# Patient Record
Sex: Male | Born: 1940 | Race: White | Hispanic: No | Marital: Married | Smoking: Former smoker
Health system: Southern US, Community
[De-identification: ages and names within clinical notes are randomized; demographics above are authoritative.]

## PROBLEM LIST (undated history)

## (undated) DIAGNOSIS — C801 Malignant (primary) neoplasm, unspecified: Secondary | ICD-10-CM

## (undated) DIAGNOSIS — E119 Type 2 diabetes mellitus without complications: Secondary | ICD-10-CM

## (undated) DIAGNOSIS — N200 Calculus of kidney: Secondary | ICD-10-CM

## (undated) DIAGNOSIS — I219 Acute myocardial infarction, unspecified: Secondary | ICD-10-CM

## (undated) DIAGNOSIS — I499 Cardiac arrhythmia, unspecified: Secondary | ICD-10-CM

## (undated) DIAGNOSIS — I10 Essential (primary) hypertension: Secondary | ICD-10-CM

## (undated) DIAGNOSIS — M1990 Unspecified osteoarthritis, unspecified site: Secondary | ICD-10-CM

## (undated) DIAGNOSIS — G4730 Sleep apnea, unspecified: Secondary | ICD-10-CM

## (undated) DIAGNOSIS — R0602 Shortness of breath: Secondary | ICD-10-CM

## (undated) HISTORY — PX: LITHOTRIPSY: SUR834

## (undated) HISTORY — PX: APPENDECTOMY: SHX54

## (undated) HISTORY — PX: HERNIA REPAIR: SHX51

## (undated) HISTORY — PX: OTHER SURGICAL HISTORY: SHX169

## (undated) HISTORY — PX: CARDIAC CATHETERIZATION: SHX172

## (undated) HISTORY — PX: CARDIAC SURGERY: SHX584

## (undated) HISTORY — PX: COLONOSCOPY W/ POLYPECTOMY: SHX1380

## (undated) HISTORY — PX: SHOULDER SOFT TISSUE BIOPSY: SUR149

---

## 2012-05-27 ENCOUNTER — Encounter (HOSPITAL_COMMUNITY): Payer: Self-pay | Admitting: Pharmacy Technician

## 2012-06-02 ENCOUNTER — Other Ambulatory Visit (HOSPITAL_COMMUNITY): Payer: Self-pay | Admitting: Orthopaedic Surgery

## 2012-06-02 ENCOUNTER — Ambulatory Visit (HOSPITAL_COMMUNITY)
Admission: RE | Admit: 2012-06-02 | Discharge: 2012-06-02 | Disposition: A | Discharge status: Home / Self Care | Payer: Medicare Other | Source: Ambulatory Visit | Attending: Orthopaedic Surgery | Admitting: Orthopaedic Surgery

## 2012-06-02 ENCOUNTER — Encounter (HOSPITAL_COMMUNITY)
Admission: RE | Admit: 2012-06-02 | Discharge: 2012-06-02 | Disposition: A | Discharge status: Home / Self Care | Payer: Medicare Other | Source: Ambulatory Visit | Attending: Orthopaedic Surgery | Admitting: Orthopaedic Surgery

## 2012-06-02 ENCOUNTER — Encounter (HOSPITAL_COMMUNITY): Payer: Self-pay

## 2012-06-02 DIAGNOSIS — Z01818 Encounter for other preprocedural examination: Secondary | ICD-10-CM | POA: Insufficient documentation

## 2012-06-02 DIAGNOSIS — I10 Essential (primary) hypertension: Secondary | ICD-10-CM | POA: Insufficient documentation

## 2012-06-02 HISTORY — DX: Unspecified osteoarthritis, unspecified site: M19.90

## 2012-06-02 HISTORY — DX: Essential (primary) hypertension: I10

## 2012-06-02 HISTORY — DX: Calculus of kidney: N20.0

## 2012-06-02 HISTORY — DX: Sleep apnea, unspecified: G47.30

## 2012-06-02 HISTORY — DX: Cardiac arrhythmia, unspecified: I49.9

## 2012-06-02 HISTORY — DX: Type 2 diabetes mellitus without complications: E11.9

## 2012-06-02 HISTORY — DX: Shortness of breath: R06.02

## 2012-06-02 HISTORY — DX: Malignant (primary) neoplasm, unspecified: C80.1

## 2012-06-02 HISTORY — DX: Acute myocardial infarction, unspecified: I21.9

## 2012-06-02 LAB — APTT: aPTT: 28 seconds (ref 24–37)

## 2012-06-02 LAB — COMPREHENSIVE METABOLIC PANEL
ALT: 23 U/L (ref 0–53)
AST: 26 U/L (ref 0–37)
Albumin: 3.6 g/dL (ref 3.5–5.2)
Alkaline Phosphatase: 57 U/L (ref 39–117)
BUN: 13 mg/dL (ref 6–23)
CO2: 27 mEq/L (ref 19–32)
Calcium: 9.5 mg/dL (ref 8.4–10.5)
Chloride: 101 mEq/L (ref 96–112)
Creatinine, Ser: 0.69 mg/dL (ref 0.50–1.35)
GFR calc Af Amer: >90 mL/min (ref >90)
GFR calc non Af Amer: >90 mL/min (ref >90)
Glucose, Bld: 145 mg/dL — ABNORMAL HIGH (ref 70–99)
Potassium: 4.2 mEq/L (ref 3.5–5.1)
Sodium: 138 mEq/L (ref 135–145)
Total Bilirubin: 0.3 mg/dL (ref 0.3–1.2)
Total Protein: 6.5 g/dL (ref 6.0–8.3)

## 2012-06-02 LAB — CBC
MCH: 30.8 pg (ref 26.0–34.0)
MCHC: 34.6 g/dL (ref 30.0–36.0)
MCV: 89.3 fL (ref 78.0–100.0)
Platelets: 162 10*3/uL (ref 150–400)
RDW: 13.8 % (ref 11.5–15.5)
WBC: 8 10*3/uL (ref 4.0–10.5)

## 2012-06-02 LAB — PROTIME-INR: INR: 1.06 (ref 0.00–1.49)

## 2012-06-02 LAB — SURGICAL PCR SCREEN
MRSA, PCR: NEGATIVE
Staphylococcus aureus: NEGATIVE

## 2012-06-02 NOTE — Progress Notes (Signed)
REQUESTED STRESS TEST, EKG, OV, ECHO FROM DR. BOHLE'S [phone]).

## 2012-06-02 NOTE — Progress Notes (Signed)
NOTIFIED SHERRY AT DR Ophelia Charter OFFICE OF NEEDING PREOP ORDERS.

## 2012-06-02 NOTE — Pre-Procedure Instructions (Signed)
21 [name]  06/02/2012   Your procedure is scheduled on:  Monday  06/08/12  Report to [facility] at 1030 AM.  Call this number if you have problems the morning of surgery: [phone]   Remember:   Do not eat food OR DRINK :After Midnight.     Take these medicines the morning of surgery with A SIP OF WATER:  ALLOPURINOL, LORTAB   Do not wear jewelry, make-up or nail polish.  Do not wear lotions, powders, or perfumes. You may wear deodorant.  Do not shave 48 hours prior to surgery. Men may shave face and neck.  Do not bring valuables to the hospital.  Contacts, dentures or bridgework may not be worn into surgery.  Leave suitcase in the car. After surgery it may be brought to your room.  For patients admitted to the hospital, checkout time is 11:00 AM the day of discharge.   Patients discharged the day of surgery will not be allowed to drive home.  Name and phone number of your driver:  Special Instructions: Shower using CHG 2 nights before surgery and the night before surgery.  If you shower the day of surgery use CHG.  Use special wash - you have one bottle of CHG for all showers.  You should use approximately 1/3 of the bottle for each shower.   Please read over the following fact sheets that you were given: Pain Booklet, Coughing and Deep Breathing, MRSA Information and Surgical Site Infection Prevention

## 2012-06-04 NOTE — Progress Notes (Signed)
Faxed Cardiolite Stress report received from [facility]-no EKG or OV note.  Placed in chart.

## 2012-06-05 ENCOUNTER — Other Ambulatory Visit (HOSPITAL_COMMUNITY): Payer: Self-pay | Admitting: Orthopaedic Surgery

## 2012-06-05 NOTE — Progress Notes (Signed)
No ECHO available,will send office note from -09-27-2011

## 2012-06-05 NOTE — H&P (Signed)
[name] is an 71 y.o. male.   Chief Complaint: pain in back and legs worse with ambulation. HPI: Pt with progressive symptoms of neurogenic claudication.  Unable to ambulate more than 20 ft.  Symptoms include pain in his low back and weakness and tingling in his legs. No bowel or bladder dysfunction. Pt has to stop and rest to prevent falling when he is trying to walk distances.  He has tried physical therapy which has not been helpful. MRI of the lumbar spine showed severe stenosis at the L4-5 level.  He will undergo a central decompression at L4-5.  Past Medical History  Diagnosis Date  . Myocardial infarction   . Dysrhythmia   . Hypertension   . Shortness of breath     WITH EXERTION   . Sleep apnea     CPAP   10  YRS 2003   . Diabetes mellitus without complication   . Kidney stones   . Cancer     PROSTATE 2006  . Arthritis     OA    Past Surgical History  Procedure Date  . Lithotripsy   . Ears     63-65  TUBES BIL EARS  . Hernia repair     1976 [facility]  . Appendectomy     2010  . Shoulder soft tissue biopsy   . Colonoscopy w/ polypectomy     No family history on file. Social History:  reports that he has quit smoking. He does not have any smokeless tobacco history on file. He reports that he drinks alcohol. He reports that he does not use illicit drugs.  Allergies:  Allergies  Allergen Reactions  . Adhesive (Tape) Rash    Medications Prior to Admission  Medication Sig Dispense Refill  . allopurinol (ZYLOPRIM) 300 MG tablet Take 300 mg by mouth daily.      . Ascorbic Acid (VITAMIN C) 1000 MG tablet Take 1,000 mg by mouth daily.      Marland Kitchen aspirin EC 81 MG tablet Take 81 mg by mouth daily.      . Ciclopirox (LOPROX) 1 % shampoo Apply 1 application topically at bedtime. For dermatitis      . fenofibrate micronized (LOFIBRA) 200 MG capsule Take 200 mg by mouth daily before breakfast.      . gabapentin (NEURONTIN) 600 MG tablet Take 600 mg by mouth 3 (three) times  daily.      . Glucosamine-Chondroit-Vit C-Mn (GLUCOSAMINE 1500 COMPLEX PO) Take 1 capsule by mouth daily.      Marland Kitchen HYDROcodone-acetaminophen (LORTAB) 7.5-500 MG per tablet Take 1 tablet by mouth every 6 (six) hours as needed. For pain      . lisinopril (PRINIVIL,ZESTRIL) 10 MG tablet Take 10 mg by mouth daily.      . meclizine (ANTIVERT) 25 MG tablet Take 25 mg by mouth 3 (three) times daily as needed. For motion sickness      . metFORMIN (GLUCOPHAGE) 500 MG tablet Take 500 mg by mouth daily with breakfast.      . Methylsulfonylmethane (MSM PO) Take 1 tablet by mouth daily.      . metroNIDAZOLE (METROGEL) 1 % gel Apply 1 application topically daily as needed. For rash      . minocycline (DYNACIN) 50 MG tablet Take 50 mg by mouth daily as needed. For roceasa      . MOMETASONE FUROATE EX Apply 1 application topically daily as needed. For rash      . Multiple Vitamin (MULTIVITAMIN WITH MINERALS)  TABS Take 1 tablet by mouth daily.      . simvastatin (ZOCOR) 40 MG tablet Take 40 mg by mouth every evening.      . thiamine 100 MG tablet Take 100 mg by mouth daily.      Marland Kitchen zolpidem (AMBIEN) 10 MG tablet Take 10 mg by mouth at bedtime as needed. For sleep        Results for orders placed during the hospital encounter of 06/08/12 (from the past 48 hour(s))  GLUCOSE, CAPILLARY     Status: Abnormal   Collection Time   06/08/12 10:06 AM      Component Value Range Comment   Glucose-Capillary 137 (*) 70 - 99 mg/dL    Dg Lumbar Spine 2-3 Views  06/08/2012  *RADIOLOGY REPORT*  Clinical Data: Spinal stenosis  LUMBAR SPINE - 2-3 VIEW  Comparison: None.  Findings: There looks to be a transitional lumbosacral segment; correlate carefully with any outside cross-sectional imaging before intervention.  Small anterior endplate spurs at multiple levels. Patchy aortic calcifications. There is no evidence of lumbar spine fracture.  Alignment is normal.  Intervertebral disc spaces are maintained.  IMPRESSION: 1.  Small end  plate spurs.  No acute abnormality.   Original Report Authenticated By: D. Andria Rhein, MD     Review of Systems  Constitutional: Negative.   HENT: Negative.   Eyes: Negative.   Respiratory: Negative.   Cardiovascular: Negative.   Gastrointestinal: Negative.   Genitourinary: Negative.   Musculoskeletal: Positive for back pain.  Skin: Negative.   Neurological: Negative.   Endo/Heme/Allergies: Negative.   Psychiatric/Behavioral: Negative.     Blood pressure 116/74, pulse 75, temperature 98.3 F (36.8 C), temperature source Oral, resp. rate 18, SpO2 98.00%. Physical Exam  Constitutional: He is oriented to person, place, and time. He appears well-developed and well-nourished.  HENT:  Head: Normocephalic and atraumatic.  Eyes: EOM are normal. Pupils are equal, round, and reactive to light.  Neck: Normal range of motion. Neck supple.  Cardiovascular: Normal rate and intact distal pulses.   Respiratory: Effort normal.  GI: Soft. Bowel sounds are normal.  Musculoskeletal:       -SLR bilateral.  No focal weakness of lower extremities. Painful ROM of LS spine  Neurological: He is alert and oriented to person, place, and time.  Skin: Skin is warm and dry.  Psychiatric: He has a normal mood and affect.     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

## 2012-06-07 MED ORDER — CEFAZOLIN SODIUM-DEXTROSE 2-3 GM-% IV SOLR
2.0000 g | INTRAVENOUS | Status: AC
  Administered 2012-06-08: 2 g via INTRAVENOUS
  Filled 2012-06-07: qty 50

## 2012-06-08 ENCOUNTER — Ambulatory Visit (HOSPITAL_COMMUNITY): Payer: Medicare Other

## 2012-06-08 ENCOUNTER — Encounter (HOSPITAL_COMMUNITY): Payer: Self-pay | Admitting: Anesthesiology

## 2012-06-08 ENCOUNTER — Encounter (HOSPITAL_COMMUNITY)
Admission: RE | Disposition: A | Discharge status: Home / Self Care | Payer: Self-pay | Source: Ambulatory Visit | Attending: Orthopaedic Surgery

## 2012-06-08 ENCOUNTER — Observation Stay (HOSPITAL_COMMUNITY)
Admission: RE | Admit: 2012-06-08 | Discharge: 2012-06-09 | Disposition: A | Discharge status: Home / Self Care | Payer: Medicare Other | Source: Ambulatory Visit | Attending: Orthopaedic Surgery | Admitting: Orthopaedic Surgery

## 2012-06-08 ENCOUNTER — Ambulatory Visit (HOSPITAL_COMMUNITY): Payer: Medicare Other | Admitting: Anesthesiology

## 2012-06-08 DIAGNOSIS — R0602 Shortness of breath: Secondary | ICD-10-CM | POA: Insufficient documentation

## 2012-06-08 DIAGNOSIS — G4730 Sleep apnea, unspecified: Secondary | ICD-10-CM | POA: Insufficient documentation

## 2012-06-08 DIAGNOSIS — E119 Type 2 diabetes mellitus without complications: Secondary | ICD-10-CM | POA: Insufficient documentation

## 2012-06-08 DIAGNOSIS — M48062 Spinal stenosis, lumbar region with neurogenic claudication: Principal | ICD-10-CM | POA: Insufficient documentation

## 2012-06-08 DIAGNOSIS — I10 Essential (primary) hypertension: Secondary | ICD-10-CM | POA: Insufficient documentation

## 2012-06-08 DIAGNOSIS — I252 Old myocardial infarction: Secondary | ICD-10-CM | POA: Insufficient documentation

## 2012-06-08 DIAGNOSIS — M1990 Unspecified osteoarthritis, unspecified site: Secondary | ICD-10-CM | POA: Insufficient documentation

## 2012-06-08 HISTORY — PX: LUMBAR LAMINECTOMY: SHX95

## 2012-06-08 LAB — GLUCOSE, CAPILLARY
Glucose-Capillary: 137 mg/dL — ABNORMAL HIGH (ref 70–99)
Glucose-Capillary: 120 mg/dL — ABNORMAL HIGH (ref 70–99)
Glucose-Capillary: 128 mg/dL — ABNORMAL HIGH (ref 70–99)
Glucose-Capillary: 103 mg/dL — ABNORMAL HIGH (ref 70–99)

## 2012-06-08 SURGERY — MICRODISCECTOMY LUMBAR LAMINECTOMY
Anesthesia: General | Site: Back | Wound class: Clean

## 2012-06-08 MED ORDER — DEXTROSE 5 % IV SOLN
500.0000 mg | Freq: Four times a day (QID) | INTRAVENOUS | Status: DC | PRN
  Administered 2012-06-08: 500 mg via INTRAVENOUS
  Filled 2012-06-08: qty 5

## 2012-06-08 MED ORDER — ACETAMINOPHEN 650 MG RE SUPP: 650.0000 mg | RECTAL | Status: DC | PRN

## 2012-06-08 MED ORDER — MENTHOL 3 MG MT LOZG: 1.0000 | LOZENGE | OROMUCOSAL | Status: DC | PRN

## 2012-06-08 MED ORDER — FENTANYL CITRATE 0.05 MG/ML IJ SOLN: 50.0000 ug | Freq: Once | INTRAMUSCULAR | Status: DC

## 2012-06-08 MED ORDER — ARTIFICIAL TEARS OP OINT
TOPICAL_OINTMENT | OPHTHALMIC | Status: DC | PRN
  Administered 2012-06-08: 1 via OPHTHALMIC

## 2012-06-08 MED ORDER — METFORMIN HCL 500 MG PO TABS
500.0000 mg | ORAL_TABLET | Freq: Every day | ORAL | Status: DC
  Administered 2012-06-09: 500 mg via ORAL
  Filled 2012-06-08 (×2): qty 1

## 2012-06-08 MED ORDER — FLEET ENEMA 7-19 GM/118ML RE ENEM: 1.0000 | ENEMA | Freq: Once | RECTAL | Status: AC | PRN

## 2012-06-08 MED ORDER — ALLOPURINOL 300 MG PO TABS
300.0000 mg | ORAL_TABLET | Freq: Every day | ORAL | Status: DC
  Filled 2012-06-08: qty 1

## 2012-06-08 MED ORDER — HYDROCODONE-ACETAMINOPHEN 5-325 MG PO TABS: 1.0000 | ORAL_TABLET | ORAL | Status: DC | PRN

## 2012-06-08 MED ORDER — INSULIN ASPART 100 UNIT/ML ~~LOC~~ SOLN
0.0000 [IU] | Freq: Three times a day (TID) | SUBCUTANEOUS | Status: DC
  Administered 2012-06-08 – 2012-06-09 (×2): 2 [IU] via SUBCUTANEOUS

## 2012-06-08 MED ORDER — ACETAMINOPHEN 10 MG/ML IV SOLN
INTRAVENOUS | Status: AC
  Filled 2012-06-08: qty 100

## 2012-06-08 MED ORDER — OXYCODONE HCL 5 MG/5ML PO SOLN: 5.0000 mg | Freq: Once | ORAL | Status: AC | PRN

## 2012-06-08 MED ORDER — PROPOFOL 10 MG/ML IV BOLUS
INTRAVENOUS | Status: DC | PRN
  Administered 2012-06-08: 180 mg via INTRAVENOUS

## 2012-06-08 MED ORDER — LIDOCAINE HCL (CARDIAC) 20 MG/ML IV SOLN
INTRAVENOUS | Status: DC | PRN
  Administered 2012-06-08: 100 mg via INTRAVENOUS

## 2012-06-08 MED ORDER — BISACODYL 10 MG RE SUPP: 10.0000 mg | Freq: Every day | RECTAL | Status: DC | PRN

## 2012-06-08 MED ORDER — PANTOPRAZOLE SODIUM 40 MG PO TBEC
40.0000 mg | DELAYED_RELEASE_TABLET | Freq: Every day | ORAL | Status: DC
  Administered 2012-06-08: 40 mg via ORAL
  Filled 2012-06-08: qty 1

## 2012-06-08 MED ORDER — VECURONIUM BROMIDE 10 MG IV SOLR
INTRAVENOUS | Status: DC | PRN
  Administered 2012-06-08: 2 mg via INTRAVENOUS

## 2012-06-08 MED ORDER — LACTATED RINGERS IV SOLN
INTRAVENOUS | Status: DC
  Administered 2012-06-08: 12:00:00 via INTRAVENOUS

## 2012-06-08 MED ORDER — ONDANSETRON HCL 4 MG/2ML IJ SOLN: 4.0000 mg | INTRAMUSCULAR | Status: DC | PRN

## 2012-06-08 MED ORDER — ROCURONIUM BROMIDE 100 MG/10ML IV SOLN
INTRAVENOUS | Status: DC | PRN
  Administered 2012-06-08: 50 mg via INTRAVENOUS

## 2012-06-08 MED ORDER — FENTANYL CITRATE 0.05 MG/ML IJ SOLN
INTRAMUSCULAR | Status: DC | PRN
  Administered 2012-06-08 (×3): 50 ug via INTRAVENOUS

## 2012-06-08 MED ORDER — SODIUM CHLORIDE 0.9 % IJ SOLN: 3.0000 mL | INTRAMUSCULAR | Status: DC | PRN

## 2012-06-08 MED ORDER — MECLIZINE HCL 25 MG PO TABS
25.0000 mg | ORAL_TABLET | Freq: Three times a day (TID) | ORAL | Status: DC | PRN
  Filled 2012-06-08: qty 1

## 2012-06-08 MED ORDER — SODIUM CHLORIDE 0.9 % IJ SOLN: 3.0000 mL | Freq: Two times a day (BID) | INTRAMUSCULAR | Status: DC

## 2012-06-08 MED ORDER — SODIUM CHLORIDE 0.9 % IV SOLN: 250.0000 mL | INTRAVENOUS | Status: DC

## 2012-06-08 MED ORDER — LACTATED RINGERS IV SOLN
INTRAVENOUS | Status: DC | PRN
  Administered 2012-06-08: 12:00:00 via INTRAVENOUS

## 2012-06-08 MED ORDER — MORPHINE SULFATE 2 MG/ML IJ SOLN: 1.0000 mg | INTRAMUSCULAR | Status: DC | PRN

## 2012-06-08 MED ORDER — THIAMINE HCL 100 MG PO TABS: 100.0000 mg | ORAL_TABLET | Freq: Every day | ORAL | Status: DC

## 2012-06-08 MED ORDER — ACETAMINOPHEN 325 MG PO TABS: 650.0000 mg | ORAL_TABLET | ORAL | Status: DC | PRN

## 2012-06-08 MED ORDER — 0.9 % SODIUM CHLORIDE (POUR BTL) OPTIME
TOPICAL | Status: DC | PRN
  Administered 2012-06-08: 1000 mL

## 2012-06-08 MED ORDER — CEFAZOLIN SODIUM 1-5 GM-% IV SOLN
1.0000 g | Freq: Three times a day (TID) | INTRAVENOUS | Status: AC
  Administered 2012-06-08 – 2012-06-09 (×2): 1 g via INTRAVENOUS
  Filled 2012-06-08 (×2): qty 50

## 2012-06-08 MED ORDER — ACETAMINOPHEN 10 MG/ML IV SOLN
1000.0000 mg | Freq: Once | INTRAVENOUS | Status: AC
  Administered 2012-06-08: 1000 mg via INTRAVENOUS
  Filled 2012-06-08: qty 100

## 2012-06-08 MED ORDER — OXYCODONE-ACETAMINOPHEN 5-325 MG PO TABS: ORAL_TABLET | ORAL | Status: DC

## 2012-06-08 MED ORDER — OXYCODONE HCL 5 MG PO TABS
ORAL_TABLET | ORAL | Status: AC
  Filled 2012-06-08: qty 1

## 2012-06-08 MED ORDER — VITAMIN C 500 MG PO TABS
1000.0000 mg | ORAL_TABLET | Freq: Every day | ORAL | Status: DC
  Filled 2012-06-08: qty 2

## 2012-06-08 MED ORDER — METOCLOPRAMIDE HCL 5 MG/ML IJ SOLN
10.0000 mg | Freq: Three times a day (TID) | INTRAMUSCULAR | Status: DC
  Filled 2012-06-08: qty 2

## 2012-06-08 MED ORDER — SIMVASTATIN 40 MG PO TABS
40.0000 mg | ORAL_TABLET | Freq: Every day | ORAL | Status: DC
  Administered 2012-06-08: 40 mg via ORAL
  Filled 2012-06-08 (×2): qty 1

## 2012-06-08 MED ORDER — GLYCOPYRROLATE 0.2 MG/ML IJ SOLN
INTRAMUSCULAR | Status: DC | PRN
  Administered 2012-06-08: 0.6 mg via INTRAVENOUS

## 2012-06-08 MED ORDER — DOCUSATE SODIUM 100 MG PO CAPS
100.0000 mg | ORAL_CAPSULE | Freq: Two times a day (BID) | ORAL | Status: DC
  Administered 2012-06-08: 100 mg via ORAL
  Filled 2012-06-08 (×3): qty 1

## 2012-06-08 MED ORDER — ASPIRIN EC 81 MG PO TBEC
81.0000 mg | DELAYED_RELEASE_TABLET | Freq: Every day | ORAL | Status: DC
  Filled 2012-06-08: qty 1

## 2012-06-08 MED ORDER — MIDAZOLAM HCL 2 MG/2ML IJ SOLN: 1.0000 mg | INTRAMUSCULAR | Status: DC | PRN

## 2012-06-08 MED ORDER — ONDANSETRON HCL 4 MG/2ML IJ SOLN
INTRAMUSCULAR | Status: DC | PRN
  Administered 2012-06-08: 4 mg via INTRAVENOUS

## 2012-06-08 MED ORDER — PROMETHAZINE HCL 25 MG/ML IJ SOLN: 6.2500 mg | INTRAMUSCULAR | Status: DC | PRN

## 2012-06-08 MED ORDER — METHOCARBAMOL 500 MG PO TABS: 500.0000 mg | ORAL_TABLET | Freq: Four times a day (QID) | ORAL | Status: DC

## 2012-06-08 MED ORDER — OXYCODONE HCL 5 MG PO TABS
5.0000 mg | ORAL_TABLET | Freq: Once | ORAL | Status: AC | PRN
  Administered 2012-06-08: 5 mg via ORAL

## 2012-06-08 MED ORDER — KETOROLAC TROMETHAMINE 30 MG/ML IJ SOLN
INTRAMUSCULAR | Status: AC
  Filled 2012-06-08: qty 1

## 2012-06-08 MED ORDER — FENOFIBRATE 160 MG PO TABS
160.0000 mg | ORAL_TABLET | Freq: Every day | ORAL | Status: DC
  Administered 2012-06-08: 160 mg via ORAL
  Filled 2012-06-08 (×2): qty 1

## 2012-06-08 MED ORDER — METRONIDAZOLE 1 % EX GEL
1.0000 "application " | Freq: Every day | CUTANEOUS | Status: DC | PRN
  Filled 2012-06-08: qty 1

## 2012-06-08 MED ORDER — SENNOSIDES-DOCUSATE SODIUM 8.6-50 MG PO TABS: 1.0000 | ORAL_TABLET | Freq: Every evening | ORAL | Status: DC | PRN

## 2012-06-08 MED ORDER — NEOSTIGMINE METHYLSULFATE 1 MG/ML IJ SOLN
INTRAMUSCULAR | Status: DC | PRN
  Administered 2012-06-08: 5 mg via INTRAVENOUS

## 2012-06-08 MED ORDER — OXYCODONE-ACETAMINOPHEN 5-325 MG PO TABS
1.0000 | ORAL_TABLET | ORAL | Status: DC | PRN
  Administered 2012-06-08: 1 via ORAL
  Filled 2012-06-08: qty 1

## 2012-06-08 MED ORDER — METHOCARBAMOL 500 MG PO TABS
500.0000 mg | ORAL_TABLET | Freq: Four times a day (QID) | ORAL | Status: DC | PRN
  Administered 2012-06-08: 500 mg via ORAL
  Filled 2012-06-08 (×2): qty 1

## 2012-06-08 MED ORDER — HYDROMORPHONE HCL PF 1 MG/ML IJ SOLN: 0.2500 mg | INTRAMUSCULAR | Status: DC | PRN

## 2012-06-08 MED ORDER — ZOLPIDEM TARTRATE 5 MG PO TABS
5.0000 mg | ORAL_TABLET | Freq: Every evening | ORAL | Status: DC | PRN
  Administered 2012-06-08: 5 mg via ORAL
  Filled 2012-06-08: qty 1

## 2012-06-08 MED ORDER — BUPIVACAINE HCL (PF) 0.25 % IJ SOLN
INTRAMUSCULAR | Status: DC | PRN
  Administered 2012-06-08: 10 mL

## 2012-06-08 MED ORDER — PANTOPRAZOLE SODIUM 40 MG IV SOLR
40.0000 mg | Freq: Every day | INTRAVENOUS | Status: DC
  Filled 2012-06-08: qty 40

## 2012-06-08 MED ORDER — LISINOPRIL 10 MG PO TABS
10.0000 mg | ORAL_TABLET | Freq: Every day | ORAL | Status: DC
  Filled 2012-06-08: qty 1

## 2012-06-08 MED ORDER — BUPIVACAINE HCL (PF) 0.25 % IJ SOLN
INTRAMUSCULAR | Status: AC
  Filled 2012-06-08: qty 30

## 2012-06-08 MED ORDER — GABAPENTIN 600 MG PO TABS
600.0000 mg | ORAL_TABLET | Freq: Three times a day (TID) | ORAL | Status: DC
  Administered 2012-06-08 (×2): 600 mg via ORAL
  Filled 2012-06-08 (×5): qty 1

## 2012-06-08 MED ORDER — PHENOL 1.4 % MT LIQD: 1.0000 | OROMUCOSAL | Status: DC | PRN

## 2012-06-08 MED ORDER — POTASSIUM CHLORIDE IN NACL 20-0.45 MEQ/L-% IV SOLN
INTRAVENOUS | Status: DC
  Administered 2012-06-09: 02:00:00 via INTRAVENOUS
  Filled 2012-06-08 (×2): qty 1000

## 2012-06-08 MED ORDER — VITAMIN B-1 100 MG PO TABS
100.0000 mg | ORAL_TABLET | Freq: Every day | ORAL | Status: DC
  Filled 2012-06-08: qty 1

## 2012-06-08 MED ORDER — KETOROLAC TROMETHAMINE 30 MG/ML IJ SOLN
30.0000 mg | Freq: Three times a day (TID) | INTRAMUSCULAR | Status: AC
  Administered 2012-06-08 – 2012-06-09 (×3): 30 mg via INTRAVENOUS
  Filled 2012-06-08 (×5): qty 1

## 2012-06-08 SURGICAL SUPPLY — 53 items
BENZOIN TINCTURE PRP APPL 2/3 (GAUZE/BANDAGES/DRESSINGS) IMPLANT
BUR ROUND FLUTED 4 SOFT TCH (BURR) ×2 IMPLANT
CANISTER SUCTION 2500CC (MISCELLANEOUS) ×2 IMPLANT
CLOTH BEACON ORANGE TIMEOUT ST (SAFETY) ×2 IMPLANT
CORDS BIPOLAR (ELECTRODE) IMPLANT
COVER SURGICAL LIGHT HANDLE (MISCELLANEOUS) ×2 IMPLANT
DERMABOND ADVANCED (GAUZE/BANDAGES/DRESSINGS) ×1
DERMABOND ADVANCED .7 DNX12 (GAUZE/BANDAGES/DRESSINGS) ×1 IMPLANT
DRAPE MICROSCOPE LEICA (MISCELLANEOUS) ×2 IMPLANT
DRAPE PROXIMA HALF (DRAPES) ×2 IMPLANT
DRSG EMULSION OIL 3X3 NADH (GAUZE/BANDAGES/DRESSINGS) IMPLANT
DRSG MEPILEX BORDER 4X4 (GAUZE/BANDAGES/DRESSINGS) ×2 IMPLANT
DURAPREP 26ML APPLICATOR (WOUND CARE) ×2 IMPLANT
ELECT REM PT RETURN 9FT ADLT (ELECTROSURGICAL) ×2
ELECTRODE REM PT RTRN 9FT ADLT (ELECTROSURGICAL) ×1 IMPLANT
GLOVE BIO SURGEON STRL SZ8.5 (GLOVE) ×4 IMPLANT
GLOVE BIOGEL PI IND STRL 7.5 (GLOVE) ×1 IMPLANT
GLOVE BIOGEL PI IND STRL 8 (GLOVE) ×1 IMPLANT
GLOVE BIOGEL PI INDICATOR 7.5 (GLOVE) ×1
GLOVE BIOGEL PI INDICATOR 8 (GLOVE) ×1
GLOVE ECLIPSE 7.0 STRL STRAW (GLOVE) ×2 IMPLANT
GLOVE ORTHO TXT STRL SZ7.5 (GLOVE) ×2 IMPLANT
GLOVE SURG SS PI 8.5 STRL IVOR (GLOVE) ×2
GLOVE SURG SS PI 8.5 STRL STRW (GLOVE) ×2 IMPLANT
GOWN PREVENTION PLUS LG XLONG (DISPOSABLE) ×2 IMPLANT
GOWN STRL NON-REIN LRG LVL3 (GOWN DISPOSABLE) IMPLANT
GOWN STRL REIN 2XL XLG LVL4 (GOWN DISPOSABLE) ×2 IMPLANT
KIT BASIN OR (CUSTOM PROCEDURE TRAY) ×2 IMPLANT
KIT ROOM TURNOVER OR (KITS) ×2 IMPLANT
MANIFOLD NEPTUNE II (INSTRUMENTS) IMPLANT
NDL SUT .5 MAYO 1.404X.05X (NEEDLE) IMPLANT
NEEDLE HYPO 25GX1X1/2 BEV (NEEDLE) ×2 IMPLANT
NEEDLE MAYO TAPER (NEEDLE)
NEEDLE SPNL 18GX3.5 QUINCKE PK (NEEDLE) ×2 IMPLANT
NS IRRIG 1000ML POUR BTL (IV SOLUTION) ×2 IMPLANT
PACK LAMINECTOMY ORTHO (CUSTOM PROCEDURE TRAY) ×2 IMPLANT
PAD ARMBOARD 7.5X6 YLW CONV (MISCELLANEOUS) ×4 IMPLANT
PATTIES SURGICAL .5 X.5 (GAUZE/BANDAGES/DRESSINGS) IMPLANT
PATTIES SURGICAL .75X.75 (GAUZE/BANDAGES/DRESSINGS) IMPLANT
SPONGE GAUZE 4X4 12PLY (GAUZE/BANDAGES/DRESSINGS) IMPLANT
STRIP CLOSURE SKIN 1/2X4 (GAUZE/BANDAGES/DRESSINGS) IMPLANT
SUT VIC AB 0 CT1 27 (SUTURE) ×1
SUT VIC AB 0 CT1 27XBRD ANBCTR (SUTURE) ×1 IMPLANT
SUT VIC AB 2-0 CT1 27 (SUTURE) ×1
SUT VIC AB 2-0 CT1 TAPERPNT 27 (SUTURE) ×1 IMPLANT
SUT VICRYL 0 TIES 12 18 (SUTURE) IMPLANT
SUT VICRYL 4-0 PS2 18IN ABS (SUTURE) ×2 IMPLANT
SUT VICRYL AB 2 0 TIES (SUTURE) IMPLANT
SYR 20ML ECCENTRIC (SYRINGE) IMPLANT
SYR CONTROL 10ML LL (SYRINGE) ×4 IMPLANT
TOWEL OR 17X24 6PK STRL BLUE (TOWEL DISPOSABLE) ×2 IMPLANT
TOWEL OR 17X26 10 PK STRL BLUE (TOWEL DISPOSABLE) ×2 IMPLANT
WATER STERILE IRR 1000ML POUR (IV SOLUTION) IMPLANT

## 2012-06-08 NOTE — Transfer of Care (Signed)
Immediate Anesthesia Transfer of Care Note  Patient: [name]  Procedure(s) Performed: Procedure(s) (LRB) with comments: MICRODISCECTOMY LUMBAR LAMINECTOMY (N/A) - L4-5 decompression  Patient Location: PACU  Anesthesia Type:General  Level of Consciousness: awake, alert , oriented and patient cooperative  Airway & Oxygen Therapy: Patient Spontanous Breathing and Patient connected to face mask oxygen  Post-op Assessment: Report given to PACU RN and Post -op Vital signs reviewed and stable  Post vital signs: Reviewed  Complications: No apparent anesthesia complications

## 2012-06-08 NOTE — Preoperative (Signed)
Beta Blockers   Reason not to administer Beta Blockers:Not Applicable 

## 2012-06-08 NOTE — Anesthesia Postprocedure Evaluation (Signed)
  Anesthesia Post-op Note  Patient: [name]  Procedure(s) Performed: Procedure(s) (LRB) with comments: MICRODISCECTOMY LUMBAR LAMINECTOMY (N/A) - L4-5 decompression  Patient Location: PACU  Anesthesia Type:General  Level of Consciousness: awake  Airway and Oxygen Therapy: Patient Spontanous Breathing  Post-op Pain: mild  Post-op Assessment: Post-op Vital signs reviewed, Patient's Cardiovascular Status Stable, Respiratory Function Stable, Patent Airway, No signs of Nausea or vomiting and Pain level controlled  Post-op Vital Signs: stable  Complications: No apparent anesthesia complications

## 2012-06-08 NOTE — Brief Op Note (Cosign Needed)
06/08/2012  2:04 PM  PATIENT:  Drucie Opitz  71 y.o. male  PRE-OPERATIVE DIAGNOSIS:  L4-5 stenosis  POST-OPERATIVE DIAGNOSIS:  L4-5 stenosis  PROCEDURE:  Procedure(s) (LRB) with comments: MICRODISCECTOMY LUMBAR LAMINECTOMY (N/A) - L4-5 decompression  SURGEON:  Surgeon(s) and Role:    * Eldred Manges, MD - Primary  PHYSICIAN ASSISTANT: Maud Deed PAC  ASSISTANTS: none   ANESTHESIA:   general  EBL:  Total I/O In: 1200 [I.V.:1200] Out: -   BLOOD ADMINISTERED:none  DRAINS: none   LOCAL MEDICATIONS USED:  MARCAINE     SPECIMEN:  No Specimen  DISPOSITION OF SPECIMEN:  N/A  COUNTS:  YES  TOURNIQUET:  * No tourniquets in log *  DICTATION: .Note written in EPIC  PLAN OF CARE: Admit for overnight observation  PATIENT DISPOSITION:  PACU - hemodynamically stable.   Delay start of Pharmacological VTE agent (>24hrs) due to surgical blood loss or risk of bleeding: yes

## 2012-06-08 NOTE — Interval H&P Note (Signed)
History and Physical Interval Note:  06/08/2012 12:12 PM  [name]  has presented today for surgery, with the diagnosis of L4-5 stenosis  The various methods of treatment have been discussed with the patient and family. After consideration of risks, benefits and other options for treatment, the patient has consented to  Procedure(s) (LRB) with comments: MICRODISCECTOMY LUMBAR LAMINECTOMY (N/A) - L4-5 decompression as a surgical intervention .  The patient's history has been reviewed, patient examined, no change in status, stable for surgery.  I have reviewed the patient's chart and labs.  Questions were answered to the patient's satisfaction.     YATES,MARK C

## 2012-06-08 NOTE — Anesthesia Preprocedure Evaluation (Addendum)
Anesthesia Evaluation  Patient identified by MRN, date of birth, ID band Patient awake    Reviewed: Allergy & Precautions, H&P , NPO status , Patient's Chart, lab work & pertinent test results  History of Anesthesia Complications Negative for: history of anesthetic complications  Airway Mallampati: II TM Distance: <3 FB Neck ROM: Full    Dental  (+) Teeth Intact, Dental Advisory Given, Poor Dentition and Chipped   Pulmonary shortness of breath and with exertion, sleep apnea and Continuous Positive Airway Pressure Ventilation , former smoker,  breath sounds clear to auscultation        Cardiovascular hypertension, Pt. on medications + CAD and + Past MI + dysrhythmias Rhythm:Regular Rate:Normal     Neuro/Psych    GI/Hepatic   Endo/Other  diabetes, Type 2, Oral Hypoglycemic Agents  Renal/GU Kidney stones      Musculoskeletal   Abdominal (+) + obese,   Peds  Hematology   Anesthesia Other Findings   Reproductive/Obstetrics                         Anesthesia Physical Anesthesia Plan  ASA: III  Anesthesia Plan: General   Post-op Pain Management:    Induction: Intravenous  Airway Management Planned: Oral ETT  Additional Equipment:   Intra-op Plan:   Post-operative Plan: Extubation in OR  Informed Consent: I have reviewed the patients History and Physical, chart, labs and discussed the procedure including the risks, benefits and alternatives for the proposed anesthesia with the patient or authorized representative who has indicated his/her understanding and acceptance.     Plan Discussed with: Surgeon and CRNA  Anesthesia Plan Comments:         Anesthesia Quick Evaluation

## 2012-06-08 NOTE — Anesthesia Procedure Notes (Signed)
Procedure Name: Intubation Date/Time: 06/08/2012 12:30 PM Performed by: Lovie Chol Pre-anesthesia Checklist: Patient identified, Emergency Drugs available, Suction available, Patient being monitored and Timeout performed Patient Re-evaluated:Patient Re-evaluated prior to inductionOxygen Delivery Method: Circle system utilized Preoxygenation: Pre-oxygenation with 100% oxygen Intubation Type: IV induction Ventilation: Oral airway inserted - appropriate to patient size and Mask ventilation without difficulty Laryngoscope Size: Miller and 2 Grade View: Grade II Tube type: Oral Tube size: 7.5 mm Number of attempts: 1 Airway Equipment and Method: Stylet Placement Confirmation: ETT inserted through vocal cords under direct vision,  positive ETCO2,  CO2 detector and breath sounds checked- equal and bilateral Secured at: 22 cm Tube secured with: Tape Dental Injury: Teeth and Oropharynx as per pre-operative assessment

## 2012-06-08 NOTE — Progress Notes (Signed)
Call to Dr. Mindi Curling office in [place], requested OV note fr. 2013. Spoke with [employer].

## 2012-06-08 NOTE — Op Note (Signed)
Test test test  Preop diagnosis: L4-5 spinal stenosis with neurogenic claudication  Postop diagnosis: Same  Procedure: L4-5 decompression and foraminotomy.  Surgeon: Annell Greening M.D.  Assistant: Maud Deed PA-C medically necessary and present for the entire procedure  Anesthesia: Gen. Plus Marcaine skin local  EBL minimal  Complications: None  This 71 year old male set progressive neurogenic claudication symptoms of worry is able to walk 100 feet at the best and sometimes only 50-75 feet. He has pain when he stands hassling or grocery cart and ORIF ambulate in a store and is having no significant right leg weakness. MRI scan showed severe stenosis at L4-5 with extradural cyst with the severe stenosis.  Procedure: After induction general anesthesia preoperative 2 g Ancef basal patient's weight at 119 kg back was prepped with DuraPrep pads are placed underneath the shoulders ulnar nerve. DuraPrep was used here squared with towels Betadine Steri-Drape applied and laminectomy sheet and drape. Timeout procedure completed needle localization initially needle was appropriate for spacer was removed out 1 left L4 5 repeat x-ray taken in this was marked. Incision was made midline subperiosteal dissection of the lamina and then a Coker clamp was placed at the top and the bottom of the planned area for decompression. 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 His predominant problem was ligamentous with him facet spurs extending out into the canal midway between anterior and posterior bony up into the canal. Chunks of thick ligament was removed upper microscope was draped and brought in. Continue removal of ligamentous material to the dura was visualized. TobraDex of the dura performed and overhanging spurs removed at the level of the pedicle. Neuro rounded up and due to previous epidural injections the  a ligamentum and the assistant is present on the right side was adhered to the dura microsecond techniques were used in order to remove and separate the Giemsa ligament and the cyst from the dura. The dura was intact once the decompression was finished hockey-stick could be run in both gutters with no length overhanging areas of ligament causing problems nerve root entry of the foramina was open. No with a pressure of the facets is hypomobility of the facet. Patient had no instability in flexion-extension x-rays. Pars was intact bilaterally. The microdiscectomy was done due to the increased mobility of the joints and the disc was firm. After initial saline solution Sandler closure with #1 Vicryl in deep fascia to work sent canis tissue for Vicryl subcuticular closure Dermabond postop dressing and transferred recovery. His count needle count was correct procedure was as posted.          t Vernon Prey.D.

## 2012-06-08 NOTE — Progress Notes (Signed)
Call to Dr. Gypsy Balsam, after rec'ing note from [facility] cardiology.  Reviewed stress test/notes /w Dr. Gypsy Balsam.

## 2012-06-09 ENCOUNTER — Encounter (HOSPITAL_COMMUNITY): Payer: Self-pay | Admitting: Orthopaedic Surgery

## 2012-06-09 NOTE — Progress Notes (Signed)
Pt provided with discharge instructions and follow up information. Dressing changed and patient has no complaints of pain at this time. He is going home with wife.

## 2012-06-09 NOTE — Discharge Summary (Signed)
Physician Discharge Summary  Patient ID: [name] MRN: [id number] DOB/AGE: [birth year] 71 y.o.  Admit date: 06/08/2012 Discharge date: 06/09/2012  Admission Diagnoses:L4-5 stenosis with neurogenic claudication  Discharge Diagnoses: same Principal Problem:  *Spinal stenosis, lumbar region, with neurogenic claudication   Discharged Condition: good  Hospital Course: underwent decompression surgery L4-5 , post op CPAP as his usual at home.   Consults: None  Significant Diagnostic Studies:  Treatments: surgery: as above  Discharge Exam: Blood pressure 110/62, pulse 64, temperature 98.4 F (36.9 C), temperature source Oral, resp. rate 16, SpO2 96.00%. AT, EHL right and left 5/5 sensory intact. dressing dry.   Disposition: home  Discharge.       Medication List     As of 06/09/2012  7:18 AM    TAKE these medications         allopurinol 300 MG tablet   Commonly known as: ZYLOPRIM   Take 300 mg by mouth daily.      aspirin EC 81 MG tablet   Take 81 mg by mouth daily.      fenofibrate micronized 200 MG capsule   Commonly known as: LOFIBRA   Take 200 mg by mouth daily before breakfast.      gabapentin 600 MG tablet   Commonly known as: NEURONTIN   Take 600 mg by mouth 3 (three) times daily.      GLUCOSAMINE 1500 COMPLEX PO   Take 1 capsule by mouth daily.      HYDROcodone-acetaminophen 7.5-500 MG per tablet   Commonly known as: LORTAB   Take 1 tablet by mouth every 6 (six) hours as needed. For pain      lisinopril 10 MG tablet   Commonly known as: PRINIVIL,ZESTRIL   Take 10 mg by mouth daily.      LOPROX 1 % shampoo   Generic drug: Ciclopirox   Apply 1 application topically at bedtime. For dermatitis      meclizine 25 MG tablet   Commonly known as: ANTIVERT   Take 25 mg by mouth 3 (three) times daily as needed. For motion sickness      metFORMIN 500 MG tablet   Commonly known as: GLUCOPHAGE   Take 500 mg by mouth daily with breakfast.     methocarbamol 500 MG tablet   Commonly known as: ROBAXIN   Take 1 tablet (500 mg total) by mouth 4 (four) times daily. As needed for muscle spasm      metroNIDAZOLE 1 % gel   Commonly known as: METROGEL   Apply 1 application topically daily as needed. For rash      minocycline 50 MG tablet   Commonly known as: DYNACIN   Take 50 mg by mouth daily as needed. For roceasa      MOMETASONE FUROATE EX   Apply 1 application topically daily as needed. For rash      MSM PO   Take 1 tablet by mouth daily.      multivitamin with minerals Tabs   Take 1 tablet by mouth daily.      oxyCODONE-acetaminophen 5-325 MG per tablet   Commonly known as: PERCOCET/ROXICET   1-2 q 4-6 hrs prn pain      simvastatin 40 MG tablet   Commonly known as: ZOCOR   Take 40 mg by mouth every evening.      thiamine 100 MG tablet   Take 100 mg by mouth daily.      vitamin C 1000 MG tablet   Take 1,000  mg by mouth daily.      zolpidem 10 MG tablet   Commonly known as: AMBIEN   Take 10 mg by mouth at bedtime as needed. For sleep           Follow-up Information    Follow up with Eldred Manges, MD. Schedule an appointment as soon as possible for a visit in 2 weeks.   Contact information:   [address] [place] [address] [phone]          Signed: Eldred Manges 06/09/2012, 7:18 AM

## 2012-06-09 NOTE — Progress Notes (Signed)
Pt was placed on CPAP of 15 cmH2O. Pt's home mask and [facility] CPAP unit. Vitals are stable HR 61 RR 18 Oxygen saturation is 97%. Pt is comfortable RT will continue to monitor.

## 2012-11-02 ENCOUNTER — Other Ambulatory Visit (HOSPITAL_COMMUNITY): Payer: Self-pay | Admitting: Orthopaedic Surgery

## 2012-11-16 ENCOUNTER — Encounter (HOSPITAL_COMMUNITY): Payer: Self-pay | Admitting: Pharmacy Technician

## 2012-11-18 ENCOUNTER — Encounter (HOSPITAL_COMMUNITY): Payer: Self-pay

## 2012-11-18 ENCOUNTER — Encounter (HOSPITAL_COMMUNITY)
Admission: RE | Admit: 2012-11-18 | Discharge: 2012-11-18 | Disposition: A | Discharge status: Home / Self Care | Payer: Medicare Other | Source: Ambulatory Visit | Attending: Orthopaedic Surgery | Admitting: Orthopaedic Surgery

## 2012-11-18 ENCOUNTER — Other Ambulatory Visit (HOSPITAL_COMMUNITY): Payer: Medicare Other

## 2012-11-18 LAB — CBC
Hemoglobin: 14 g/dL (ref 13.0–17.0)
MCH: 30.4 pg (ref 26.0–34.0)
MCHC: 35.1 g/dL (ref 30.0–36.0)
Platelets: 168 10*3/uL (ref 150–400)
RBC: 4.6 MIL/uL (ref 4.22–5.81)

## 2012-11-18 LAB — TYPE AND SCREEN
ABO/RH(D): B POS
Antibody Screen: NEGATIVE

## 2012-11-18 LAB — COMPREHENSIVE METABOLIC PANEL
ALT: 33 U/L (ref 0–53)
AST: 22 U/L (ref 0–37)
Alkaline Phosphatase: 85 U/L (ref 39–117)
Calcium: 9.3 mg/dL (ref 8.4–10.5)
GFR calc Af Amer: >90 mL/min (ref >90)
Glucose, Bld: 173 mg/dL — ABNORMAL HIGH (ref 70–99)
Potassium: 4 mEq/L (ref 3.5–5.1)
Sodium: 141 mEq/L (ref 135–145)
Total Protein: 7 g/dL (ref 6.0–8.3)

## 2012-11-18 LAB — APTT: aPTT: 28 seconds (ref 24–37)

## 2012-11-18 LAB — SURGICAL PCR SCREEN
MRSA, PCR: NEGATIVE
Staphylococcus aureus: POSITIVE — AB

## 2012-11-18 NOTE — Progress Notes (Signed)
Office called and stated they were unable to access sleep study from 2003.

## 2012-11-18 NOTE — Pre-Procedure Instructions (Signed)
[name]  11/18/2012   Your procedure is scheduled on:      Monday  11/23/12  Report to [facility] at 920 AM.  Call this number if you have problems the morning of surgery: [phone]   Remember:   Do not eat food or drink liquids after midnight.   Take these medicines the morning of surgery with A SIP OF WATER:  ALLOPURINOL   Do not wear jewelry, make-up or nail polish.  Do not wear lotions, powders, or perfumes. You may wear deodorant.  Do not shave 48 hours prior to surgery. Men may shave face and neck.  Do not bring valuables to the hospital.  [facility] is not responsible                   for any belongings or valuables.  Contacts, dentures or bridgework may not be worn into surgery.  Leave suitcase in the car. After surgery it may be brought to your room.  For patients admitted to the hospital, checkout time is 11:00 AM the day of  discharge.   Patients discharged the day of surgery will not be allowed to drive  home.  Name and phone number of your driver:  Special Instructions: Shower using CHG 2 nights before surgery and the night before surgery.  If you shower the day of surgery use CHG.  Use special wash - you have one bottle of CHG for all showers.  You should use approximately 1/3 of the bottle for each shower.   Please read over the following fact sheets that you were given: Pain Booklet, Coughing and Deep Breathing, MRSA Information and Surgical Site Infection Prevention

## 2012-11-22 MED ORDER — DEXTROSE 5 % IV SOLN
3.0000 g | INTRAVENOUS | Status: AC
  Administered 2012-11-23: 3 g via INTRAVENOUS
  Filled 2012-11-22: qty 3000

## 2012-11-22 NOTE — H&P (Signed)
[facility]   A Division of [employer]   [address], [place], [place] [address] Telephone: [phone]  Fax: [phone]     PATIENT: Nathaniel, Duarte   MR#: [id number]  DOB: [birth year]   Visit Date: 10/27/2012    Chief Complaint: Painful Right Hip Osteoarthritis A 72 year old male returns, and he is ambulatory with a cane.  He states since his lumbar decompression he has been able to walk further.  Originally could only walk 10 to 15 feet, and now he can walk several hundred feet, but with ambulation he is beginning to have an occasional sharp popping in his hip, which pain he rates at an 8/10 with catching.  He has to stop and freeze.  The pain radiates anterior in the thigh, sometimes stopping above the knee.  No pain on the opposite, left side.  If he stops and sits, he gets some relief.  Sometimes he will have soreness in that region for a few hours.  By the end of the day, he has increased pain.  He has been taking Percocet for the pain.  He still uses a cane in the opposite, left hand.  He states he has great difficulty walking without the cane.   MEDICATIONS:  Include allopurinol 300 mg for his gout, simvastatin 40 mg daily, fenofibrate 200 mg daily, lisinopril 10 mg once a day, metformin 500 mg once a day, meclizine p.r.n. dizziness, minocycline for rosacea flare-ups, gabapentin 600 mg t.i.d., and the Percocet as needed for pain.  He has some creams that he uses for skin rashes and takes some multivitamins, vitamins C, B12, glucosamine chondroitin, calcium, and 1 aspirin a day.  He had some hydrocodone called in from July.  He has been using this when he ran out of his Percocet supply.   PRIMARY CARE PHYSICIAN:  Dr. Lequita Halt.    PAST SURGICAL HISTORY:  Includes kidney stone lithotripsy in 1993, prostate seeds in December 2006, hernia surgery in 1976, ear surgery in 1963 and 1964, appendectomy in 2010.   FAMILY HISTORY:  Positive for diabetes,  heart disease, hypertension, prostate surgery, hearing loss, and gout.   SOCIAL HISTORY:  Married to his wife, [name], who is with him today.  He is retired.  Does not smoke; he did smoke for about 10 years.  He rarely drinks.   REVIEW OF SYSTEMS:  Positive for diabetes, gout, hypertension, sleep apnea, irregular heartbeat, and cancer.  He has had improvement in his severe neurogenic claudication since his lumbar decompression surgery.   PHYSICAL EXAMINATION:  Patient is alert and oriented x3.  Height 5 feet 10 inches, weight 254.  Extraocular movements intact.  Reflexes are 2+ and symmetrical.  No rash over exposed skin.  No accessory muscle inspiratory effort.  Lungs:  Clear.  Heart:  Regular rate and rhythm.  He has a history of irregularity but not noted today.  Abdomen:  Soft, nontender.  He has reproduction of his pain with 10 degrees of internal rotation of his right hip.  No limitation of internal and external rotation at 45 and 45 of his left hip.  No hip flexion contracture in either hip.  Pain with internal rotation of the right hip with and without distraction.  Sensory testing is normal.  Negative straight leg raising.  Distal pulses are 2+.  No venous stasis changes.   RADIOGRAPHS:  X-rays are obtained, AP pelvis and frog-leg of his right hip.  This shows collapse of the joint space,  bone-on-bone changes, marginal osteophytes, subchondral cyst formation on both sides of the joint, and no sclerosis.  On frog-leg view there is no joint space noted.  Seed implants are present.  The opposite hip shows no hip osteoarthritis.  Lumbar decompression area has a normal appearance.   ASSESSMENT:  Severe right hip osteoarthritis, bone-on-bone changes.   PLAN:  We discussed options.  He has taken anti-inflammatories without relief.  He is having sharp catching in his hip and states that if he did not have his walker or his cane with him, he would have fallen several times.  We reviewed options in  severe hip osteoarthritis.  He states he would like to proceed with hip arthritis surgery with total hip arthroplasty.  Procedure discussed.  Surgical technique discussed.  Risks of surgery discussed.  He understands and requests we proceed.  Risks of dislocation, fracture, partial weightbearing, subsidence, revision surgery, nerve problems, and infection all discussed.  Questions were elicited and answered.    For additional information please see handwritten notes, reports, orders and prescriptions in this chart.      Joevon Holliman C. Ophelia Charter, M.D.    Auto-Authenticated by Veverly Fells. Ophelia Charter, M.D.  MCY/lm/af DD: 10/27/2012  DT: 10/28/2012   cc: Gerarda Gunther. Marcello Moores, M.D.([place] [employer])   Lon Lequita Halt, MD([employer])   Pre-op PCR postitive for MSSA,  Ancef ordered.

## 2012-11-23 ENCOUNTER — Inpatient Hospital Stay (HOSPITAL_COMMUNITY): Payer: Medicare Other

## 2012-11-23 ENCOUNTER — Inpatient Hospital Stay (HOSPITAL_COMMUNITY)
Admission: RE | Admit: 2012-11-23 | Discharge: 2012-11-25 | DRG: 470 | Disposition: A | Discharge status: Home Health | Payer: Medicare Other | Source: Ambulatory Visit | Attending: Orthopaedic Surgery | Admitting: Orthopaedic Surgery

## 2012-11-23 ENCOUNTER — Inpatient Hospital Stay (HOSPITAL_COMMUNITY): Payer: Medicare Other | Admitting: Anesthesiology

## 2012-11-23 ENCOUNTER — Encounter (HOSPITAL_COMMUNITY)
Admission: RE | Disposition: A | Discharge status: Home / Self Care | Payer: Self-pay | Source: Ambulatory Visit | Attending: Orthopaedic Surgery

## 2012-11-23 ENCOUNTER — Encounter (HOSPITAL_COMMUNITY): Payer: Self-pay | Admitting: Anesthesiology

## 2012-11-23 DIAGNOSIS — Z8249 Family history of ischemic heart disease and other diseases of the circulatory system: Secondary | ICD-10-CM

## 2012-11-23 DIAGNOSIS — M109 Gout, unspecified: Secondary | ICD-10-CM | POA: Diagnosis present

## 2012-11-23 DIAGNOSIS — Z833 Family history of diabetes mellitus: Secondary | ICD-10-CM

## 2012-11-23 DIAGNOSIS — M1610 Unilateral primary osteoarthritis, unspecified hip: Principal | ICD-10-CM | POA: Diagnosis present

## 2012-11-23 DIAGNOSIS — Z822 Family history of deafness and hearing loss: Secondary | ICD-10-CM

## 2012-11-23 DIAGNOSIS — E119 Type 2 diabetes mellitus without complications: Secondary | ICD-10-CM | POA: Diagnosis present

## 2012-11-23 DIAGNOSIS — L719 Rosacea, unspecified: Secondary | ICD-10-CM | POA: Diagnosis present

## 2012-11-23 DIAGNOSIS — Z87891 Personal history of nicotine dependence: Secondary | ICD-10-CM

## 2012-11-23 DIAGNOSIS — I10 Essential (primary) hypertension: Secondary | ICD-10-CM | POA: Diagnosis present

## 2012-11-23 DIAGNOSIS — M1611 Unilateral primary osteoarthritis, right hip: Secondary | ICD-10-CM | POA: Diagnosis present

## 2012-11-23 DIAGNOSIS — R42 Dizziness and giddiness: Secondary | ICD-10-CM | POA: Diagnosis present

## 2012-11-23 HISTORY — PX: TOTAL HIP ARTHROPLASTY: SHX124

## 2012-11-23 LAB — URINE MICROSCOPIC-ADD ON

## 2012-11-23 LAB — URINALYSIS, ROUTINE W REFLEX MICROSCOPIC
Bilirubin Urine: NEGATIVE
Glucose, UA: NEGATIVE mg/dL
Hgb urine dipstick: NEGATIVE
Ketones, ur: NEGATIVE mg/dL
Leukocytes, UA: NEGATIVE
Protein, ur: NEGATIVE mg/dL
pH: 5.5 (ref 5.0–8.0)

## 2012-11-23 LAB — GLUCOSE, CAPILLARY
Glucose-Capillary: 131 mg/dL — ABNORMAL HIGH (ref 70–99)
Glucose-Capillary: 138 mg/dL — ABNORMAL HIGH (ref 70–99)
Glucose-Capillary: 169 mg/dL — ABNORMAL HIGH (ref 70–99)

## 2012-11-23 LAB — HEMOGLOBIN AND HEMATOCRIT, BLOOD
HCT: 37.5 % — ABNORMAL LOW (ref 39.0–52.0)
Hemoglobin: 12.8 g/dL — ABNORMAL LOW (ref 13.0–17.0)

## 2012-11-23 SURGERY — ARTHROPLASTY, HIP, TOTAL, ANTERIOR APPROACH
Anesthesia: General | Site: Hip | Laterality: Right | Wound class: Clean

## 2012-11-23 MED ORDER — DOCUSATE SODIUM 100 MG PO CAPS
100.0000 mg | ORAL_CAPSULE | Freq: Two times a day (BID) | ORAL | Status: DC
  Administered 2012-11-23 – 2012-11-25 (×4): 100 mg via ORAL
  Filled 2012-11-23 (×5): qty 1

## 2012-11-23 MED ORDER — OXYCODONE HCL 5 MG PO TABS
5.0000 mg | ORAL_TABLET | ORAL | Status: DC | PRN
  Administered 2012-11-23 – 2012-11-25 (×7): 10 mg via ORAL
  Filled 2012-11-23 (×7): qty 2

## 2012-11-23 MED ORDER — FENTANYL CITRATE 0.05 MG/ML IJ SOLN: 50.0000 ug | Freq: Once | INTRAMUSCULAR | Status: DC

## 2012-11-23 MED ORDER — METHOCARBAMOL 500 MG PO TABS
500.0000 mg | ORAL_TABLET | Freq: Four times a day (QID) | ORAL | Status: DC | PRN
  Administered 2012-11-23 – 2012-11-24 (×2): 500 mg via ORAL
  Filled 2012-11-23 (×2): qty 1

## 2012-11-23 MED ORDER — PHENYLEPHRINE HCL 10 MG/ML IJ SOLN
INTRAMUSCULAR | Status: DC | PRN
  Administered 2012-11-23: 40 ug via INTRAVENOUS

## 2012-11-23 MED ORDER — ALLOPURINOL 300 MG PO TABS
300.0000 mg | ORAL_TABLET | Freq: Every day | ORAL | Status: DC
  Administered 2012-11-23 – 2012-11-25 (×3): 300 mg via ORAL
  Filled 2012-11-23 (×3): qty 1

## 2012-11-23 MED ORDER — LACTATED RINGERS IV SOLN
INTRAVENOUS | Status: DC
  Administered 2012-11-23: 11:00:00 via INTRAVENOUS

## 2012-11-23 MED ORDER — VITAMIN C 500 MG PO TABS
1000.0000 mg | ORAL_TABLET | Freq: Every day | ORAL | Status: DC
  Administered 2012-11-23 – 2012-11-25 (×3): 1000 mg via ORAL
  Filled 2012-11-23 (×3): qty 2

## 2012-11-23 MED ORDER — LIDOCAINE HCL (CARDIAC) 20 MG/ML IV SOLN
INTRAVENOUS | Status: DC | PRN
  Administered 2012-11-23: 100 mg via INTRAVENOUS

## 2012-11-23 MED ORDER — INSULIN ASPART 100 UNIT/ML ~~LOC~~ SOLN
0.0000 [IU] | Freq: Three times a day (TID) | SUBCUTANEOUS | Status: DC
  Administered 2012-11-23: 3 [IU] via SUBCUTANEOUS
  Administered 2012-11-24: 2 [IU] via SUBCUTANEOUS
  Administered 2012-11-24: 3 [IU] via SUBCUTANEOUS
  Administered 2012-11-24: 2 [IU] via SUBCUTANEOUS
  Administered 2012-11-25 (×2): 3 [IU] via SUBCUTANEOUS

## 2012-11-23 MED ORDER — ADULT MULTIVITAMIN W/MINERALS CH
1.0000 | ORAL_TABLET | Freq: Every day | ORAL | Status: DC
  Administered 2012-11-23 – 2012-11-25 (×3): 1 via ORAL
  Filled 2012-11-23 (×3): qty 1

## 2012-11-23 MED ORDER — OXYCODONE HCL 5 MG PO TABS: 5.0000 mg | ORAL_TABLET | Freq: Once | ORAL | Status: DC | PRN

## 2012-11-23 MED ORDER — ONDANSETRON HCL 4 MG/2ML IJ SOLN
INTRAMUSCULAR | Status: DC | PRN
  Administered 2012-11-23: 4 mg via INTRAVENOUS

## 2012-11-23 MED ORDER — METHOCARBAMOL 100 MG/ML IJ SOLN
500.0000 mg | Freq: Four times a day (QID) | INTRAVENOUS | Status: DC | PRN
  Administered 2012-11-23: 500 mg via INTRAVENOUS
  Filled 2012-11-23: qty 5

## 2012-11-23 MED ORDER — PROPOFOL 10 MG/ML IV BOLUS
INTRAVENOUS | Status: DC | PRN
  Administered 2012-11-23: 200 mg via INTRAVENOUS

## 2012-11-23 MED ORDER — HYDROMORPHONE HCL PF 1 MG/ML IJ SOLN
INTRAMUSCULAR | Status: AC
  Filled 2012-11-23: qty 1

## 2012-11-23 MED ORDER — 0.9 % SODIUM CHLORIDE (POUR BTL) OPTIME
TOPICAL | Status: DC | PRN
  Administered 2012-11-23: 1000 mL

## 2012-11-23 MED ORDER — CEFAZOLIN SODIUM-DEXTROSE 2-3 GM-% IV SOLR
2.0000 g | Freq: Four times a day (QID) | INTRAVENOUS | Status: AC
  Administered 2012-11-23 – 2012-11-24 (×2): 2 g via INTRAVENOUS
  Filled 2012-11-23 (×2): qty 50

## 2012-11-23 MED ORDER — ALBUTEROL SULFATE HFA 108 (90 BASE) MCG/ACT IN AERS
INHALATION_SPRAY | RESPIRATORY_TRACT | Status: DC | PRN
  Administered 2012-11-23 (×3): 2 via RESPIRATORY_TRACT

## 2012-11-23 MED ORDER — MECLIZINE HCL 25 MG PO TABS
25.0000 mg | ORAL_TABLET | Freq: Three times a day (TID) | ORAL | Status: DC | PRN
  Filled 2012-11-23: qty 1

## 2012-11-23 MED ORDER — MIDAZOLAM HCL 2 MG/2ML IJ SOLN: 1.0000 mg | INTRAMUSCULAR | Status: DC | PRN

## 2012-11-23 MED ORDER — ZOLPIDEM TARTRATE 5 MG PO TABS: 5.0000 mg | ORAL_TABLET | Freq: Every evening | ORAL | Status: DC | PRN

## 2012-11-23 MED ORDER — ROCURONIUM BROMIDE 100 MG/10ML IV SOLN
INTRAVENOUS | Status: DC | PRN
  Administered 2012-11-23: 10 mg via INTRAVENOUS
  Administered 2012-11-23: 50 mg via INTRAVENOUS
  Administered 2012-11-23 (×3): 10 mg via INTRAVENOUS

## 2012-11-23 MED ORDER — LISINOPRIL 10 MG PO TABS
10.0000 mg | ORAL_TABLET | Freq: Every day | ORAL | Status: DC
  Administered 2012-11-23 – 2012-11-25 (×3): 10 mg via ORAL
  Filled 2012-11-23 (×3): qty 1

## 2012-11-23 MED ORDER — ONDANSETRON HCL 4 MG/2ML IJ SOLN: 4.0000 mg | Freq: Four times a day (QID) | INTRAMUSCULAR | Status: DC | PRN

## 2012-11-23 MED ORDER — BISACODYL 10 MG RE SUPP: 10.0000 mg | Freq: Every day | RECTAL | Status: DC | PRN

## 2012-11-23 MED ORDER — ONDANSETRON HCL 4 MG PO TABS: 4.0000 mg | ORAL_TABLET | Freq: Four times a day (QID) | ORAL | Status: DC | PRN

## 2012-11-23 MED ORDER — MINOCYCLINE HCL 50 MG PO TABS: 50.0000 mg | ORAL_TABLET | Freq: Every day | ORAL | Status: DC | PRN

## 2012-11-23 MED ORDER — HYDROMORPHONE HCL PF 1 MG/ML IJ SOLN
INTRAMUSCULAR | Status: AC
  Administered 2012-11-23: 0.5 mg via INTRAVENOUS
  Filled 2012-11-23: qty 1

## 2012-11-23 MED ORDER — MENTHOL 3 MG MT LOZG: 1.0000 | LOZENGE | OROMUCOSAL | Status: DC | PRN

## 2012-11-23 MED ORDER — FENTANYL CITRATE 0.05 MG/ML IJ SOLN
INTRAMUSCULAR | Status: DC | PRN
  Administered 2012-11-23: 100 ug via INTRAVENOUS
  Administered 2012-11-23 (×3): 50 ug via INTRAVENOUS
  Administered 2012-11-23: 100 ug via INTRAVENOUS
  Administered 2012-11-23: 50 ug via INTRAVENOUS
  Administered 2012-11-23: 100 ug via INTRAVENOUS

## 2012-11-23 MED ORDER — MORPHINE SULFATE 2 MG/ML IJ SOLN: 2.0000 mg | INTRAMUSCULAR | Status: DC | PRN

## 2012-11-23 MED ORDER — GLYCOPYRROLATE 0.2 MG/ML IJ SOLN
INTRAMUSCULAR | Status: DC | PRN
  Administered 2012-11-23: 0.6 mg via INTRAVENOUS

## 2012-11-23 MED ORDER — ACETAMINOPHEN 650 MG RE SUPP: 650.0000 mg | Freq: Four times a day (QID) | RECTAL | Status: DC | PRN

## 2012-11-23 MED ORDER — PHENOL 1.4 % MT LIQD: 1.0000 | OROMUCOSAL | Status: DC | PRN

## 2012-11-23 MED ORDER — ARTIFICIAL TEARS OP OINT
TOPICAL_OINTMENT | OPHTHALMIC | Status: DC | PRN
  Administered 2012-11-23: 1 via OPHTHALMIC

## 2012-11-23 MED ORDER — MIDAZOLAM HCL 5 MG/5ML IJ SOLN
INTRAMUSCULAR | Status: DC | PRN
  Administered 2012-11-23: 2 mg via INTRAVENOUS

## 2012-11-23 MED ORDER — METFORMIN HCL 500 MG PO TABS
500.0000 mg | ORAL_TABLET | Freq: Every day | ORAL | Status: DC
  Administered 2012-11-24 – 2012-11-25 (×2): 500 mg via ORAL
  Filled 2012-11-23 (×3): qty 1

## 2012-11-23 MED ORDER — SIMVASTATIN 40 MG PO TABS
40.0000 mg | ORAL_TABLET | Freq: Every evening | ORAL | Status: DC
  Administered 2012-11-23 – 2012-11-24 (×2): 40 mg via ORAL
  Filled 2012-11-23 (×3): qty 1

## 2012-11-23 MED ORDER — ACETAMINOPHEN 325 MG PO TABS: 650.0000 mg | ORAL_TABLET | Freq: Four times a day (QID) | ORAL | Status: DC | PRN

## 2012-11-23 MED ORDER — OXYCODONE HCL 5 MG/5ML PO SOLN: 5.0000 mg | Freq: Once | ORAL | Status: DC | PRN

## 2012-11-23 MED ORDER — FENOFIBRATE 54 MG PO TABS
54.0000 mg | ORAL_TABLET | Freq: Every day | ORAL | Status: DC
  Administered 2012-11-23 – 2012-11-25 (×3): 54 mg via ORAL
  Filled 2012-11-23 (×3): qty 1

## 2012-11-23 MED ORDER — MOMETASONE FUROATE 0.1 % EX CREA
TOPICAL_CREAM | Freq: Every day | CUTANEOUS | Status: DC | PRN
  Filled 2012-11-23: qty 15

## 2012-11-23 MED ORDER — FLEET ENEMA 7-19 GM/118ML RE ENEM: 1.0000 | ENEMA | Freq: Once | RECTAL | Status: AC | PRN

## 2012-11-23 MED ORDER — ASPIRIN EC 325 MG PO TBEC
325.0000 mg | DELAYED_RELEASE_TABLET | Freq: Every day | ORAL | Status: DC
  Administered 2012-11-24 – 2012-11-25 (×2): 325 mg via ORAL
  Filled 2012-11-23 (×3): qty 1

## 2012-11-23 MED ORDER — POTASSIUM CHLORIDE IN NACL 20-0.45 MEQ/L-% IV SOLN
INTRAVENOUS | Status: DC
  Administered 2012-11-23: 18:00:00 via INTRAVENOUS
  Filled 2012-11-23 (×3): qty 1000

## 2012-11-23 MED ORDER — CICLOPIROX 1 % EX SHAM
1.0000 "application " | MEDICATED_SHAMPOO | Freq: Every day | CUTANEOUS | Status: DC
  Administered 2012-11-23: 1 via TOPICAL
  Administered 2012-11-24: 22:00:00 via TOPICAL

## 2012-11-23 MED ORDER — METRONIDAZOLE 1 % EX GEL
1.0000 "application " | Freq: Two times a day (BID) | CUTANEOUS | Status: DC
  Administered 2012-11-24 – 2012-11-25 (×3): 1 via TOPICAL

## 2012-11-23 MED ORDER — NEOSTIGMINE METHYLSULFATE 1 MG/ML IJ SOLN
INTRAMUSCULAR | Status: DC | PRN
  Administered 2012-11-23: 4 mg via INTRAVENOUS

## 2012-11-23 MED ORDER — METOCLOPRAMIDE HCL 5 MG/ML IJ SOLN: 5.0000 mg | Freq: Three times a day (TID) | INTRAMUSCULAR | Status: DC | PRN

## 2012-11-23 MED ORDER — SENNOSIDES-DOCUSATE SODIUM 8.6-50 MG PO TABS: 1.0000 | ORAL_TABLET | Freq: Every evening | ORAL | Status: DC | PRN

## 2012-11-23 MED ORDER — METOCLOPRAMIDE HCL 5 MG PO TABS
5.0000 mg | ORAL_TABLET | Freq: Three times a day (TID) | ORAL | Status: DC | PRN
  Filled 2012-11-23: qty 2

## 2012-11-23 MED ORDER — LACTATED RINGERS IV SOLN
INTRAVENOUS | Status: DC | PRN
  Administered 2012-11-23 (×3): via INTRAVENOUS

## 2012-11-23 MED ORDER — MINOCYCLINE HCL 50 MG PO CAPS
50.0000 mg | ORAL_CAPSULE | Freq: Every day | ORAL | Status: DC | PRN
  Filled 2012-11-23: qty 1

## 2012-11-23 MED ORDER — HYDROMORPHONE HCL PF 1 MG/ML IJ SOLN
0.2500 mg | INTRAMUSCULAR | Status: DC | PRN
  Administered 2012-11-23: 0.5 mg via INTRAVENOUS

## 2012-11-23 MED FILL — Rocuronium Bromide IV Soln 100 MG/10ML (10 MG/ML): INTRAVENOUS | Qty: 10 | Status: AC

## 2012-11-23 SURGICAL SUPPLY — 53 items
BLADE SAW SGTL 18X1.27X75 (BLADE) ×2 IMPLANT
BLADE SURG ROTATE 9660 (MISCELLANEOUS) IMPLANT
CAPT HIP PF COP ×2 IMPLANT
CELLS DAT CNTRL 66122 CELL SVR (MISCELLANEOUS) ×1 IMPLANT
CLOTH BEACON ORANGE TIMEOUT ST (SAFETY) ×2 IMPLANT
CLSR STERI-STRIP ANTIMIC 1/2X4 (GAUZE/BANDAGES/DRESSINGS) ×2 IMPLANT
COVER SURGICAL LIGHT HANDLE (MISCELLANEOUS) ×2 IMPLANT
DERMABOND ADHESIVE PROPEN (GAUZE/BANDAGES/DRESSINGS) ×1
DERMABOND ADVANCED .7 DNX6 (GAUZE/BANDAGES/DRESSINGS) ×1 IMPLANT
DRAPE C-ARM 42X72 X-RAY (DRAPES) ×2 IMPLANT
DRAPE STERI IOBAN 125X83 (DRAPES) ×2 IMPLANT
DRAPE U-SHAPE 47X51 STRL (DRAPES) ×6 IMPLANT
DRSG MEPILEX BORDER 4X8 (GAUZE/BANDAGES/DRESSINGS) ×2 IMPLANT
DURAPREP 26ML APPLICATOR (WOUND CARE) ×2 IMPLANT
ELECT BLADE 4.0 EZ CLEAN MEGAD (MISCELLANEOUS)
ELECT BLADE TIP CTD 4 INCH (ELECTRODE) ×2 IMPLANT
ELECT CAUTERY BLADE 6.4 (BLADE) ×2 IMPLANT
ELECT REM PT RETURN 9FT ADLT (ELECTROSURGICAL) ×2
ELECTRODE BLDE 4.0 EZ CLN MEGD (MISCELLANEOUS) IMPLANT
ELECTRODE REM PT RTRN 9FT ADLT (ELECTROSURGICAL) ×1 IMPLANT
FACESHIELD LNG OPTICON STERILE (SAFETY) ×4 IMPLANT
GAUZE XEROFORM 1X8 LF (GAUZE/BANDAGES/DRESSINGS) ×2 IMPLANT
GLOVE BIOGEL PI IND STRL 7.5 (GLOVE) ×1 IMPLANT
GLOVE BIOGEL PI IND STRL 8 (GLOVE) ×1 IMPLANT
GLOVE BIOGEL PI INDICATOR 7.5 (GLOVE) ×1
GLOVE BIOGEL PI INDICATOR 8 (GLOVE) ×1
GLOVE ECLIPSE 7.0 STRL STRAW (GLOVE) ×2 IMPLANT
GLOVE ORTHO TXT STRL SZ7.5 (GLOVE) ×2 IMPLANT
GOWN PREVENTION PLUS LG XLONG (DISPOSABLE) IMPLANT
GOWN STRL NON-REIN LRG LVL3 (GOWN DISPOSABLE) ×4 IMPLANT
GOWN STRL REIN XL XLG (GOWN DISPOSABLE) ×2 IMPLANT
KIT BASIN OR (CUSTOM PROCEDURE TRAY) ×2 IMPLANT
KIT ROOM TURNOVER OR (KITS) ×2 IMPLANT
MANIFOLD NEPTUNE II (INSTRUMENTS) ×2 IMPLANT
NS IRRIG 1000ML POUR BTL (IV SOLUTION) ×2 IMPLANT
PACK TOTAL JOINT (CUSTOM PROCEDURE TRAY) ×2 IMPLANT
PAD ARMBOARD 7.5X6 YLW CONV (MISCELLANEOUS) ×4 IMPLANT
RTRCTR WOUND ALEXIS 18CM MED (MISCELLANEOUS) ×2
SPONGE LAP 18X18 X RAY DECT (DISPOSABLE) ×2 IMPLANT
SPONGE LAP 4X18 X RAY DECT (DISPOSABLE) IMPLANT
STAPLER VISISTAT 35W (STAPLE) ×2 IMPLANT
SUT ETHIBOND NAB CT1 #1 30IN (SUTURE) ×4 IMPLANT
SUT VIC AB 0 CT1 27 (SUTURE) ×2
SUT VIC AB 0 CT1 27XBRD ANBCTR (SUTURE) ×2 IMPLANT
SUT VIC AB 1 CT1 27 (SUTURE) ×2
SUT VIC AB 1 CT1 27XBRD ANBCTR (SUTURE) ×2 IMPLANT
SUT VIC AB 2-0 CT1 27 (SUTURE) ×3
SUT VIC AB 2-0 CT1 TAPERPNT 27 (SUTURE) ×3 IMPLANT
SUT VICRYL 4-0 PS2 18IN ABS (SUTURE) ×2 IMPLANT
TOWEL OR 17X24 6PK STRL BLUE (TOWEL DISPOSABLE) ×2 IMPLANT
TOWEL OR 17X26 10 PK STRL BLUE (TOWEL DISPOSABLE) ×4 IMPLANT
TRAY FOLEY CATH 14FR (SET/KITS/TRAYS/PACK) IMPLANT
WATER STERILE IRR 1000ML POUR (IV SOLUTION) ×4 IMPLANT

## 2012-11-23 NOTE — Progress Notes (Signed)
UR COMPLETED  

## 2012-11-23 NOTE — Brief Op Note (Cosign Needed)
11/23/2012  2:33 PM  PATIENT:  [name]  72 y.o. male  PRE-OPERATIVE DIAGNOSIS:  Right Hip Osteoarthritis  POST-OPERATIVE DIAGNOSIS:  Right Hip Osteoarthritis  PROCEDURE:  Procedure(s) with comments: TOTAL HIP ARTHROPLASTY ANTERIOR APPROACH (Right) - Right Total Hip Arthroplasty, Direct Anterior Approach  SURGEON:  Surgeon(s) and Role:    * Eldred Manges, MD - Primary  PHYSICIAN ASSISTANT: Maud Deed [facility]  ASSISTANTS: none   ANESTHESIA:   general  EBL:  Total I/O In: 2000 [I.V.:2000] Out: 1200 [Urine:300; Blood:900]  BLOOD ADMINISTERED:none  DRAINS: none   LOCAL MEDICATIONS USED:  NONE  SPECIMEN:  No Specimen  DISPOSITION OF SPECIMEN:  N/A  COUNTS:  YES  TOURNIQUET:  * No tourniquets in log *  DICTATION: .Note written in EPIC  PLAN OF CARE: Admit to inpatient   PATIENT DISPOSITION:  PACU - hemodynamically stable.   Delay start of Pharmacological VTE agent (>24hrs) due to surgical blood loss or risk of bleeding: no

## 2012-11-23 NOTE — Progress Notes (Signed)
Patient's pulse rate is 48. RN at bedside.

## 2012-11-23 NOTE — Interval H&P Note (Signed)
History and Physical Interval Note:  11/23/2012 11:49 AM  [name]  has presented today for surgery, with the diagnosis of Right Hip Osteoarthritis  The various methods of treatment have been discussed with the patient and family. After consideration of risks, benefits and other options for treatment, the patient has consented to  Procedure(s) with comments: TOTAL HIP ARTHROPLASTY ANTERIOR APPROACH (Right) - Right Total Hip Arthroplasty, Direct Anterior Approach as a surgical intervention .  The patient's history has been reviewed, patient examined, no change in status, stable for surgery.  I have reviewed the patient's chart and labs.  Questions were answered to the patient's satisfaction.     Niv Darley C

## 2012-11-23 NOTE — Anesthesia Preprocedure Evaluation (Addendum)
Anesthesia Evaluation  Patient identified by MRN, date of birth, ID band Patient awake    Reviewed: Allergy & Precautions, H&P , NPO status , Patient's Chart, lab work & pertinent test results  History of Anesthesia Complications Negative for: history of anesthetic complications  Airway Mallampati: II TM Distance: <3 FB Neck ROM: Full    Dental  (+) Teeth Intact, Dental Advisory Given, Poor Dentition and Chipped   Pulmonary shortness of breath and with exertion, sleep apnea and Continuous Positive Airway Pressure Ventilation , former smoker,  breath sounds clear to auscultation        Cardiovascular hypertension, Pt. on medications + CAD and + Past MI Rhythm:Regular Rate:Normal  Pt states he has history of "missed beats" of his heart.  Has worn a holter monitor and is followed by cardiology   Neuro/Psych    GI/Hepatic   Endo/Other  diabetes, Type 2, Oral Hypoglycemic Agents  Renal/GU Kidney stones      Musculoskeletal   Abdominal (+) + obese,   Peds  Hematology   Anesthesia Other Findings   Reproductive/Obstetrics                          Anesthesia Physical Anesthesia Plan  ASA: III  Anesthesia Plan: General   Post-op Pain Management:    Induction: Intravenous  Airway Management Planned: Oral ETT  Additional Equipment:   Intra-op Plan:   Post-operative Plan: Extubation in OR  Informed Consent: I have reviewed the patients History and Physical, chart, labs and discussed the procedure including the risks, benefits and alternatives for the proposed anesthesia with the patient or authorized representative who has indicated his/her understanding and acceptance.     Plan Discussed with: CRNA and Surgeon  Anesthesia Plan Comments:         Anesthesia Quick Evaluation

## 2012-11-23 NOTE — Progress Notes (Signed)
Pt took off CPAP mask stating it was too loud. Pt on [place] at this time with normal SpO2. Pt will ask wife to bring in home machine for tomorrow night. Pt in no distress at this time.

## 2012-11-23 NOTE — Transfer of Care (Signed)
Immediate Anesthesia Transfer of Care Note  Patient: [name]  Procedure(s) Performed: Procedure(s) with comments: TOTAL HIP ARTHROPLASTY ANTERIOR APPROACH (Right) - Right Total Hip Arthroplasty, Direct Anterior Approach  Patient Location: PACU  Anesthesia Type:General  Level of Consciousness: awake  Airway & Oxygen Therapy: Patient Spontanous Breathing and Patient connected to face mask oxygen  Post-op Assessment: Report given to PACU RN, Post -op Vital signs reviewed and stable and Patient moving all extremities  Post vital signs: Reviewed and stable  Complications: No apparent anesthesia complications

## 2012-11-23 NOTE — Op Note (Signed)
Test test  Preop diagnosis: right hip osteoarthritis  Postop diagnosis: Same  Procedure: Right total hip arthroplasty. Direct anterior approach. Corail Depuy #12 stem standard offset neck +1.5 ball 54 mm Pinnacle cup no hole with centralizer and 10 liner placed anterolateral.  Surgeon: Annell Greening M.D.  Asst.: Maud Deed PA-C medically necessary and present for the entire procedure  Anesthesia Gen. plus Marcaine skin local  EBL 200 cc  Brief history 72 year old male with progressive right hip osteoarthritis flattening of the head bone-on-bone changes pronounced limp failed anti-inflammatories use of a cane and got temporary relief with intra-articular injection of the hip.  Procedure after induction general anesthesia for catheter placement patient placed on the [facility] table with the application of the bed for the patient was transferred onto the table. Central post was placed position a Foley catheter. C-arm is brought in after draping and before prepping to confirm good visualization of the operative right hip as well as ability to see both lesser trochanter for leg length measurements.  Hip was prepped with DuraPrep 3 g Ancef was given preoperatively. Usual split sheets drapes large shower curtain Betadine Steri-Drape. Timeout procedure was completed. Anterior draped incision was made over the tensor muscle. Fascia was identified grasped with Allis clamp muscle elevated. The rubber skin protector was placed underneath the skin above the fascia folded down to protect the skin throughout the case. His retractor replaced. Neck was palpated with fingertip and OV and tonsil clamp was used to divide the anterior transverse vessels cross the neck from the circumflex. Capsule was opened and the retractors are placed inside the capsule for visualization of the neck. Saws placed on the neck checked under fluoroscopy and the neck was cut rechecking under fluoroscopy to make sure the neck cup was  adequate angulation adequate length. It was removed with a corkscrew. SM was prepared for sequential reaming down based the fovea trial sizes progressing from 46 at 53. Initially 54 cup was inserted was not quite tight about the 2 mm left to the base of the fovea there is some soft tissue it cleaned out and again a little bit remaining of the labrum on the edges was removed which had inadvertently been left. Once this was removed and touchup of the 53 reamer fit for cup was then inserted impacted in place and was stable was checked on fluoroscopy for impaction make sure there was good abduction and cup flexion. Trial of the 0 liner was inserted. Femur was then prepared sequentially with the cookie-cutter, chili pepper sequential broaching up to 1111 almost gave a tight fit but he was enlarged and the 12 size was used. With trial sizer +1.5 neck there is restoration of leg length the with external rotation there was some impingement posteriorly with some redundant capsule from the patient's hypertrophic osteoarthritis. The superior capsule of caught posteriorly along the year around him on the acetabular osteophytes. Acetabular osteophytes are turned back as well some of the redundant capsule this corrected the attention was sides to a placed a 10 anterolateral liner. Patient neutral position with the x-ray rotated before resection of the spur and redundant capsule the patient would dislocate and 80 external rotation. Penetration occurred  With the patient was 4-5 mm short to flattening erosive head and acetabular wear.  12 broach with a standard neck 1.5 head and on 10 liner was inserted with 1 are placed anterolateral. It was stable in 90 past 90 there was no shuck good stability in internal rotation and we'll  and was extremely difficult to re\re dislocate the head despite traction applied about circulator when he was pulling hard with distraction and a follow him popped out external rotation and then the  centralizer was placed and acetabular shell the +10 Polly permanent liner with lipase anterolateral and then permanent Corail stem and with the 1.5 neck. Identical findings of excellent stability.  Irrigated one small vein was coagulated. There was no remaining anterior capsule for closure. Posterior capsule was intact. Locking suture stitch was placed in the fascia followed by to closure then the skin subcuticular closure Dermabond postop dressing and transferred to her room. Patient tolerated procedure well was neurologically intact recovering with platelets equal.            Vernon Prey.D.

## 2012-11-23 NOTE — Progress Notes (Signed)
Chaplain stopped in to see pt very briefly. Had seen pt's wife in waiting area during surgery today and told her I would check on him. Pt drowsy but said surgery went "better than expected." Pt very pleased that doctor said he would be able to put full weight on leg right away. Pt looking forward to PT tomorrow.

## 2012-11-23 NOTE — Evaluation (Signed)
Physical Therapy Evaluation Patient Details Name: [name] MRN: [id number] DOB: 1941/05/18 Today's Date: 11/23/2012 Time: 9147-8295 PT Time Calculation (min): 24 min  PT Assessment / Plan / Recommendation Clinical Impression  Patient is a 72 yo male s/p Rt THA.  Patient with decreased strength, ROM of RLE, decreased activity tolerance and balance, all impacting functional mobility.  Will benefit from acute PT to address these issues.    PT Assessment  Patient needs continued PT services    Follow Up Recommendations  Home health PT;Supervision/Assistance - 24 hour    Does the patient have the potential to tolerate intense rehabilitation      Barriers to Discharge None      Equipment Recommendations  Rolling walker with 5" wheels    Recommendations for Other Services     Frequency 7X/week    Precautions / Restrictions Precautions Precautions: None Restrictions Weight Bearing Restrictions: Yes RLE Weight Bearing: Weight bearing as tolerated   Pertinent Vitals/Pain       Mobility  Bed Mobility Bed Mobility: Supine to Sit;Sitting - Scoot to Edge of Bed Supine to Sit: 4: Min assist;With rails;HOB elevated Sitting - Scoot to Edge of Bed: 4: Min guard;With rail Details for Bed Mobility Assistance: Verbal cues for technique.  Assist to move RLE off of bed.  Able to sit at EOB x 5 minutes with good balance. Transfers Transfers: Sit to Stand;Stand to Sit Sit to Stand: 4: Min assist;With upper extremity assist;From bed Stand to Sit: 4: Min assist;With upper extremity assist;With armrests;To chair/3-in-1 Details for Transfer Assistance: Verbal cues for hand placement.  Assist to rise to standing, for balance/safety. Ambulation/Gait Ambulation/Gait Assistance: 4: Min assist Ambulation Distance (Feet): 5 Feet Assistive device: Rolling walker Ambulation/Gait Assistance Details: Verbal cues for safe use of RW and gait sequence. Gait Pattern: Step-to pattern;Decreased stance  time - right;Decreased step length - left;Antalgic Gait velocity: Slow gait speed    Exercises Total Joint Exercises Ankle Circles/Pumps: AROM;Both;10 reps;Seated   PT Diagnosis: Difficulty walking;Acute pain  PT Problem List: Decreased strength;Decreased range of motion;Decreased activity tolerance;Decreased balance;Decreased mobility;Decreased knowledge of use of DME;Decreased knowledge of precautions;Obesity;Pain PT Treatment Interventions: DME instruction;Gait training;Stair training;Functional mobility training;Therapeutic exercise;Patient/family education   PT Goals Acute Rehab PT Goals PT Goal Formulation: With patient Time For Goal Achievement: 11/30/12 Potential to Achieve Goals: Good Pt will go Supine/Side to Sit: with supervision;with HOB 0 degrees PT Goal: Supine/Side to Sit - Progress: Goal set today Pt will go Sit to Supine/Side: with supervision;with HOB 0 degrees PT Goal: Sit to Supine/Side - Progress: Goal set today Pt will go Sit to Stand: with supervision;with upper extremity assist PT Goal: Sit to Stand - Progress: Goal set today Pt will Ambulate: 51 - 150 feet;with supervision;with rolling walker PT Goal: Ambulate - Progress: Goal set today Pt will Go Up / Down Stairs: 3-5 stairs;with min assist;with rail(s);with least restrictive assistive device PT Goal: Up/Down Stairs - Progress: Goal set today Pt will Perform Home Exercise Program: with supervision, verbal cues required/provided PT Goal: Perform Home Exercise Program - Progress: Goal set today  Visit Information  Last PT Received On: 11/23/12 Assistance Needed: +1    Subjective Data  Subjective: "I wasn't sure I'd be able to put weight on my leg" Patient Stated Goal: To return home   Prior Functioning  Home Living Lives With: Spouse Available Help at Discharge: Family;Available 24 hours/day Type of Home: House Home Access: Stairs to enter [employer] of Steps: 4 Entrance Stairs-Rails:  Right Home  Layout: One level Bathroom Shower/Tub: Health visitor: Handicapped height Bathroom Accessibility: Yes How Accessible: Accessible via walker Home Adaptive Equipment: Straight cane;Shower chair without back Prior Function Level of Independence: Independent with assistive device(s);Needs assistance Needs Assistance: Meal Prep;Light Housekeeping Meal Prep: Moderate Light Housekeeping: Maximal Able to Take Stairs?: Yes Driving: Yes (short distances) Vocation: Retired Musician: No difficulties    Copywriter, advertising Arousal/Alertness: Awake/alert Behavior During Therapy: WFL for tasks assessed/performed Overall Cognitive Status: Within Functional Limits for tasks assessed    Extremity/Trunk Assessment Right Upper Extremity Assessment RUE ROM/Strength/Tone: WFL for tasks assessed RUE Sensation: WFL - Light Touch Left Upper Extremity Assessment LUE ROM/Strength/Tone: WFL for tasks assessed LUE Sensation: WFL - Light Touch Right Lower Extremity Assessment RLE ROM/Strength/Tone: Deficits;Unable to fully assess;Due to pain RLE ROM/Strength/Tone Deficits: Able to assist with moving RLE off of bed Left Lower Extremity Assessment LLE ROM/Strength/Tone: WFL for tasks assessed LLE Sensation: WFL - Light Touch Trunk Assessment Trunk Assessment: Normal   Balance Balance Balance Assessed: Yes Static Sitting Balance Static Sitting - Balance Support: No upper extremity supported;Feet supported Static Sitting - Level of Assistance: 5: Stand by assistance Static Sitting - Comment/# of Minutes: 5 Static Standing Balance Static Standing - Balance Support: Bilateral upper extremity supported Static Standing - Level of Assistance: 4: Min assist Static Standing - Comment/# of Minutes: 2 minutes.  Patient leaning posteriorly  End of Session PT - End of Session Equipment Utilized During Treatment: Gait belt;Oxygen Activity Tolerance: Patient limited  by pain;Patient limited by fatigue Patient left: in chair;with call bell/phone within reach Nurse Communication: Mobility status  GP     Vena Austria 11/23/2012, 6:50 PM Durenda Hurt. Renaldo Fiddler, [facility] Acute Rehab Services Pager [phone]

## 2012-11-23 NOTE — Anesthesia Postprocedure Evaluation (Signed)
  Anesthesia Post-op Note  Patient: [name]  Procedure(s) Performed: Procedure(s) with comments: TOTAL HIP ARTHROPLASTY ANTERIOR APPROACH (Right) - Right Total Hip Arthroplasty, Direct Anterior Approach  Patient Location: PACU  Anesthesia Type:General  Level of Consciousness: awake  Airway and Oxygen Therapy: Patient Spontanous Breathing  Post-op Pain: mild  Post-op Assessment: Post-op Vital signs reviewed, Patient's Cardiovascular Status Stable, Respiratory Function Stable, Patent Airway, No signs of Nausea or vomiting and Pain level controlled  Post-op Vital Signs: stable  Complications: No apparent anesthesia complications

## 2012-11-23 NOTE — Anesthesia Procedure Notes (Signed)
Procedure Name: Intubation Date/Time: 11/23/2012 12:02 PM Performed by: Luster Landsberg Pre-anesthesia Checklist: Patient identified, Emergency Drugs available, Suction available and Patient being monitored Patient Re-evaluated:Patient Re-evaluated prior to inductionOxygen Delivery Method: Circle system utilized Preoxygenation: Pre-oxygenation with 100% oxygen Intubation Type: IV induction Ventilation: Mask ventilation without difficulty and Oral airway inserted - appropriate to patient size Laryngoscope Size: Mac and 3 Grade View: Grade III Tube type: Oral Tube size: 7.5 mm Number of attempts: 1 Airway Equipment and Method: Stylet Placement Confirmation: ETT inserted through vocal cords under direct vision,  positive ETCO2 and breath sounds checked- equal and bilateral Secured at: 21 cm Tube secured with: Tape Dental Injury: Teeth and Oropharynx as per pre-operative assessment

## 2012-11-24 DIAGNOSIS — M1611 Unilateral primary osteoarthritis, right hip: Secondary | ICD-10-CM | POA: Diagnosis present

## 2012-11-24 LAB — CBC
Hemoglobin: 11 g/dL — ABNORMAL LOW (ref 13.0–17.0)
MCH: 30.5 pg (ref 26.0–34.0)
MCHC: 35.1 g/dL (ref 30.0–36.0)
RDW: 13.9 % (ref 11.5–15.5)

## 2012-11-24 LAB — BASIC METABOLIC PANEL
Calcium: 8.5 mg/dL (ref 8.4–10.5)
GFR calc Af Amer: >90 mL/min (ref >90)
GFR calc non Af Amer: 87 mL/min — ABNORMAL LOW (ref >90)
Glucose, Bld: 157 mg/dL — ABNORMAL HIGH (ref 70–99)
Sodium: 135 mEq/L (ref 135–145)

## 2012-11-24 LAB — GLUCOSE, CAPILLARY: Glucose-Capillary: 134 mg/dL — ABNORMAL HIGH (ref 70–99)

## 2012-11-24 NOTE — Progress Notes (Signed)
Physical Therapy Progress Note  11/24/12 1654  PT Visit Information  Last PT Received On 11/24/12  Assistance Needed +1  PT Time Calculation  PT Start Time 1607  PT Stop Time 1625  PT Time Calculation (min) 18 min  Subjective Data  Subjective "I'll walk but not sure how far I can go."  Patient Stated Goal To return home  Precautions  Precautions None  Restrictions  Weight Bearing Restrictions Yes  RLE Weight Bearing WBAT  Cognition  Arousal/Alertness Awake/alert  Behavior During Therapy WFL for tasks assessed/performed  Overall Cognitive Status Within Functional Limits for tasks assessed  Transfers  Transfers Sit to Stand;Stand to Sit  Sit to Stand 4: Min assist;With upper extremity assist;From bed  Stand to Sit 4: Min assist;With upper extremity assist;With armrests;To chair/3-in-1  Details for Transfer Assistance (A) to elevate trunk OOB and cues for hand placement.  Ambulation/Gait  Ambulation/Gait Assistance 4: Min guard  Ambulation Distance (Feet) 15 Feet  Assistive device Rolling walker  Ambulation/Gait Assistance Details Minguard for safety.  Cues for step sequence.  Pt having difficulty advancing right LE due to increase pain this session.  Gait Pattern Step-to pattern;Decreased stance time - right;Decreased step length - left;Antalgic  Gait velocity Slow gait speed  Stairs No  PT - End of Session  Equipment Utilized During Treatment Gait belt  Activity Tolerance Patient limited by pain  Patient left in chair;with call bell/phone within reach;with family/visitor present  Nurse Communication Mobility status  PT - Assessment/Plan  Comments on Treatment Session Pt limited this session due to increase pain.  Pt unable to practice stairs again this PM due to pain.  Pt plans to stay another day for pain control.  Plan to practice stair negotiation with family next session.  PT Plan Discharge plan remains appropriate;Frequency remains appropriate  PT Frequency 7X/week   Follow Up Recommendations Home health PT;Supervision/Assistance - 24 hour  PT equipment Rolling walker with 5" wheels  Acute Rehab PT Goals  PT Goal Formulation With patient  Time For Goal Achievement 11/30/12  Potential to Achieve Goals Good  Pt will go Sit to Stand with supervision;with upper extremity assist  PT Goal: Sit to Stand - Progress Progressing toward goal  Pt will Ambulate 51 - 150 feet;with supervision;with rolling walker  PT Goal: Ambulate - Progress Progressing toward goal  PT General Charges  $$ ACUTE PT VISIT 1 Procedure  PT Treatments  $Gait Training 8-22 mins   Pt reports 7/10 right hip pain.  Diplomatic Services operational officer entered and gave pt pain meds at end of session.  [place], [place] DPT [phone]

## 2012-11-24 NOTE — Progress Notes (Signed)
Subjective: 1 Day Post-Op Procedure(s) (LRB): TOTAL HIP ARTHROPLASTY ANTERIOR APPROACH (Right) Patient reports pain as mild.   Walked to bathroom Using po pain meds  Objective: Vital signs in last 24 hours: Temp:  [97 F (36.1 C)-98 F (36.7 C)] 97.7 F (36.5 C) (06/10 0635) Pulse Rate:  [48-84] 55 (06/10 0635) Resp:  [9-22] 18 (06/10 0635) BP: (119-143)/(62-75) 135/68 mmHg (06/10 0635) SpO2:  [94 %-98 %] 96 % (06/10 0635)  Intake/Output from previous day: 06/09 0701 - 06/10 0700 In: 2600 [I.V.:2600] Out: 1350 [Urine:450; Blood:900] Intake/Output this shift:     Recent Labs  11/23/12 1643 11/24/12 0408  HGB 12.8* 11.0*    Recent Labs  11/23/12 1643 11/24/12 0408  WBC  --  11.9*  RBC  --  3.61*  HCT 37.5* 31.3*  PLT  --  173    Recent Labs  11/24/12 0408  NA 135  K 4.1  CL 101  CO2 24  BUN 17  CREATININE 0.82  GLUCOSE 157*  CALCIUM 8.5   No results found for this basename: LABPT, INR,  in the last 72 hours  Neurovascular intact Sensation intact distally Intact pulses distally Dorsiflexion/Plantar flexion intact Incision: dressing C/D/I  Assessment/Plan: 1 Day Post-Op Procedure(s) (LRB): TOTAL HIP ARTHROPLASTY ANTERIOR APPROACH (Right) D/C IV fluids Plan for discharge tomorrow  Katryna Tschirhart M 11/24/2012, 8:25 AM

## 2012-11-24 NOTE — Progress Notes (Signed)
Occupational Therapy Evaluation Patient Details Name: [name] MRN: [id number] DOB: [birth year] Today's Date: 11/24/2012 Time: 7829-5621 OT Time Calculation (min): 41 min  OT Assessment / Plan / Recommendation Clinical Impression  72 yo s/p ant THA. PTA, pt independent with ADL and mobility. Pt making slow progress today. difficulty with ambulation this am  - primarily limited by weakness and pain. discussed pain mgnt with nsg. Will plan to see in am. If pt does not progress, may need to stay until Wednes. Pt will benefit from skilled OT services to facilitate D/C home with 24/7 S of wife due to below deficits.    OT Assessment  Patient needs continued OT Services    Follow Up Recommendations  No OT follow up    Barriers to Discharge None    Equipment Recommendations  3 in 1 bedside comode    Recommendations for Other Services    Frequency  Min 2X/week    Precautions / Restrictions Precautions Precautions: None Restrictions Weight Bearing Restrictions: Yes RLE Weight Bearing: Weight bearing as tolerated   Pertinent Vitals/Pain 6. nsg notified. Ice applied    ADL  Grooming: Set up Where Assessed - Grooming: Unsupported sitting Upper Body Bathing: Set up Where Assessed - Upper Body Bathing: Unsupported sitting Lower Body Bathing: Maximal assistance Where Assessed - Lower Body Bathing: Supported sit to stand Upper Body Dressing: Set up;Supervision/safety Where Assessed - Upper Body Dressing: Unsupported sitting Lower Body Dressing: Maximal assistance Where Assessed - Lower Body Dressing: Supported sit to stand Toilet Transfer: Minimal assistance Toilet Transfer Method: Sit to stand;Other (comment) (ambulating) Toilet Transfer Equipment: Bedside commode Toileting - Clothing Manipulation and Hygiene: Supervision/safety Where Assessed - Toileting Clothing Manipulation and Hygiene: Sit to stand from 3-in-1 or toilet Tub/Shower Transfer: Other (comment) (demonstrated  technique) Equipment Used: Gait belt;Rolling walker;Reacher;Long-handled sponge;Sock aid;Long-handled shoe horn Transfers/Ambulation Related to ADLs: min a from lower level ADL Comments: Educated on availability of AE and appropriate DME    OT Diagnosis: Generalized weakness;Acute pain  OT Problem List: Decreased strength;Decreased range of motion;Decreased activity tolerance;Decreased knowledge of use of DME or AE;Decreased knowledge of precautions;Obesity;Pain OT Treatment Interventions: Self-care/ADL training;Therapeutic exercise;Energy conservation;DME and/or AE instruction;Therapeutic activities;Patient/family education   OT Goals Acute Rehab OT Goals OT Goal Formulation: With patient Time For Goal Achievement: 12/08/12 Potential to Achieve Goals: Good ADL Goals Pt Will Perform Lower Body Bathing: with min assist;with caregiver independent in assisting;Sit to stand from chair;with adaptive equipment;with cueing (comment type and amount);Unsupported ADL Goal: Lower Body Bathing - Progress: Goal set today Pt Will Perform Lower Body Dressing: with min assist;with caregiver independent in assisting;Sit to stand from chair;Unsupported;with adaptive equipment;with cueing (comment type and amount) ADL Goal: Lower Body Dressing - Progress: Goal set today Pt Will Transfer to Toilet: with supervision;with caregiver independent in assisting;Ambulation;with DME;3-in-1 ADL Goal: Toilet Transfer - Progress: Goal set today Pt Will Perform Tub/Shower Transfer: with min assist;with caregiver independent in assisting;Ambulation;with DME ADL Goal: Tub/Shower Transfer - Progress: Goal set today Additional ADL Goal #1: Complete bed mobility with min A of wife ADL Goal: Additional Goal #1 - Progress: Goal set today  Visit Information  Last OT Received On: 11/24/12 Assistance Needed: +1    Subjective Data      Prior Functioning     Home Living Lives With: Spouse Available Help at Discharge:  Family;Available 24 hours/day Type of Home: House Home Access: Stairs to enter [employer] of Steps: 4 Entrance Stairs-Rails: Right Home Layout: One level Bathroom Shower/Tub: Health visitor:  Handicapped height Bathroom Accessibility: Yes How Accessible: Accessible via walker Home Adaptive Equipment: Straight cane;Shower chair without back Prior Function Level of Independence: Independent with assistive device(s);Needs assistance Needs Assistance: Meal Prep;Light Housekeeping Meal Prep: Moderate Light Housekeeping: Maximal Able to Take Stairs?: Yes Driving: Yes (short distances) Vocation: Retired Musician: No difficulties         Vision/Perception     Copywriter, advertising Arousal/Alertness: Awake/alert Behavior During Therapy: WFL for tasks assessed/performed Overall Cognitive Status: Within Functional Limits for tasks assessed    Extremity/Trunk Assessment Right Upper Extremity Assessment RUE ROM/Strength/Tone: WFL for tasks assessed RUE Sensation: WFL - Light Touch Left Upper Extremity Assessment LUE ROM/Strength/Tone: WFL for tasks assessed LUE Sensation: WFL - Light Touch Right Lower Extremity Assessment RLE ROM/Strength/Tone: Deficits;Unable to fully assess;Due to pain RLE ROM/Strength/Tone Deficits: Able to assist with moving RLE off of bed Left Lower Extremity Assessment LLE ROM/Strength/Tone: WFL for tasks assessed LLE Sensation: WFL - Light Touch Trunk Assessment Trunk Assessment: Normal     Mobility Bed Mobility Bed Mobility: Supine to Sit;Sitting - Scoot to Edge of Bed Supine to Sit: 2: Max assist;HOB flat Sitting - Scoot to Edge of Bed: 4: Min guard;With rail Details for Bed Mobility Assistance: Verbal cues for technique.  Assist to move RLE off of bed.  Able to sit at EOB x 5 minutes with good balance. Transfers Sit to Stand: 4: Min assist;With upper extremity assist;From bed Stand to Sit: 4: Min  assist;With upper extremity assist;With armrests;To chair/3-in-1 Details for Transfer Assistance: Verbal cues for hand placement.  Assist to rise to standing, for balance/safety.     Exercise     Balance Balance Balance Assessed: Yes Static Sitting Balance Static Sitting - Balance Support: No upper extremity supported;Feet supported Static Sitting - Level of Assistance: 5: Stand by assistance Static Standing Balance Static Standing - Balance Support: Bilateral upper extremity supported Static Standing - Level of Assistance: 4: Min assist   End of Session OT - End of Session Equipment Utilized During Treatment: Gait belt Activity Tolerance: Patient limited by fatigue;Patient limited by pain Patient left: in chair;with call bell/phone within reach;with family/visitor present Nurse Communication: Mobility status  GO     Daylan Juhnke,HILLARY 11/24/2012, 4:28 PM [facility], OTR/L  [phone] 11/24/2012

## 2012-11-24 NOTE — Progress Notes (Signed)
Physical Therapy Treatment Patient Details Name: [name] MRN: [id number] DOB: [birth year] Today's Date: 11/24/2012 Time: 1010-1044 PT Time Calculation (min): 34 min  PT Assessment / Plan / Recommendation Comments on Treatment Session  Pt able to increase ambulation distance and perform stair negotiation.  Pt still hestiant about d/c home therefore will practice steps in PM and address any other concerns.  .    Follow Up Recommendations  Home health PT;Supervision/Assistance - 24 hour     Equipment Recommendations  Rolling walker with 5" wheels    Frequency 7X/week   Plan Discharge plan remains appropriate;Frequency remains appropriate    Precautions / Restrictions Precautions Precautions: None Restrictions Weight Bearing Restrictions: Yes RLE Weight Bearing: Weight bearing as tolerated   Pertinent Vitals/Pain 5/10 right hip pain    Mobility  Bed Mobility Bed Mobility: Not assessed Transfers Transfers: Sit to Stand;Stand to Sit Sit to Stand: 4: Min guard;From bed;With upper extremity assist Stand to Sit: 4: Min assist;With upper extremity assist;With armrests;To chair/3-in-1 Details for Transfer Assistance: Minguard for safety with cues for hand placement.  (A) to slowly descend to recliner. Ambulation/Gait Ambulation/Gait Assistance: 4: Min guard Ambulation Distance (Feet): 50 Feet Assistive device: Rolling walker Ambulation/Gait Assistance Details: Minguard for safety with cues for proper step sequence Gait Pattern: Step-to pattern;Decreased stance time - right;Decreased step length - left;Antalgic Gait velocity: Slow gait speed Stairs: Yes Stairs Assistance: 4: Min assist Stairs Assistance Details (indicate cue type and reason): (A) to manage RW and cues for step sequence and RW placement Stair Management Technique: One rail Right;Backwards;With walker Number of Stairs: 2    Exercises     PT Diagnosis:    PT Problem List:   PT Treatment Interventions:      PT Goals Acute Rehab PT Goals PT Goal Formulation: With patient Time For Goal Achievement: 11/30/12 Potential to Achieve Goals: Good Pt will go Supine/Side to Sit: with supervision;with HOB 0 degrees PT Goal: Supine/Side to Sit - Progress: Progressing toward goal Pt will go Sit to Supine/Side: with supervision;with HOB 0 degrees PT Goal: Sit to Supine/Side - Progress: Progressing toward goal Pt will go Sit to Stand: with supervision;with upper extremity assist PT Goal: Sit to Stand - Progress: Progressing toward goal Pt will Ambulate: 51 - 150 feet;with supervision;with rolling walker PT Goal: Ambulate - Progress: Progressing toward goal Pt will Go Up / Down Stairs: 3-5 stairs;with min assist;with rail(s);with least restrictive assistive device PT Goal: Up/Down Stairs - Progress: Progressing toward goal Pt will Perform Home Exercise Program: with supervision, verbal cues required/provided PT Goal: Perform Home Exercise Program - Progress: Progressing toward goal  Visit Information  Last PT Received On: 11/24/12 Assistance Needed: +1    Subjective Data  Subjective: "I'm not sure about going home today.  I'll think about it and will do another session this afternoon right?" Patient Stated Goal: To return home   Cognition  Cognition Arousal/Alertness: Awake/alert Behavior During Therapy: WFL for tasks assessed/performed Overall Cognitive Status: Within Functional Limits for tasks assessed    Balance     End of Session PT - End of Session Equipment Utilized During Treatment: Gait belt Activity Tolerance: Patient tolerated treatment well Patient left: in chair;with call bell/phone within reach Nurse Communication: Mobility status   GP     [name] 11/24/2012, 11:09 AM [name], PT DPT [phone]

## 2012-11-25 LAB — CBC
HCT: 29.3 % — ABNORMAL LOW (ref 39.0–52.0)
Platelets: 169 10*3/uL (ref 150–400)
RBC: 3.38 MIL/uL — ABNORMAL LOW (ref 4.22–5.81)
RDW: 14.2 % (ref 11.5–15.5)
WBC: 15.3 10*3/uL — ABNORMAL HIGH (ref 4.0–10.5)

## 2012-11-25 LAB — GLUCOSE, CAPILLARY
Glucose-Capillary: 192 mg/dL — ABNORMAL HIGH (ref 70–99)
Glucose-Capillary: 178 mg/dL — ABNORMAL HIGH (ref 70–99)

## 2012-11-25 LAB — URINE CULTURE

## 2012-11-25 MED ORDER — ASPIRIN 325 MG PO TBEC: 325.0000 mg | DELAYED_RELEASE_TABLET | Freq: Every day | ORAL | Status: AC

## 2012-11-25 MED ORDER — CIPROFLOXACIN HCL 500 MG PO TABS: 500.0000 mg | ORAL_TABLET | Freq: Two times a day (BID) | ORAL | Status: AC

## 2012-11-25 MED ORDER — METHOCARBAMOL 500 MG PO TABS: 500.0000 mg | ORAL_TABLET | Freq: Four times a day (QID) | ORAL | Status: AC | PRN

## 2012-11-25 MED ORDER — OXYCODONE-ACETAMINOPHEN 5-325 MG PO TABS: 1.0000 | ORAL_TABLET | ORAL | Status: AC | PRN

## 2012-11-25 NOTE — Progress Notes (Signed)
Subjective: 2 Days Post-Op Procedure(s) (LRB): TOTAL HIP ARTHROPLASTY ANTERIOR APPROACH (Right) Patient reports pain as mild. Well controlled pain.  Mostly pain with activity Out of bed to chair and bathroom this am  Ready for discharge after PT today  Objective: Vital signs in last 24 hours: Temp:  [99 F (37.2 C)-99.3 F (37.4 C)] 99.3 F (37.4 C) (06/11 0505) Pulse Rate:  [48-53] 52 (06/11 0505) Resp:  [16-18] 16 (06/11 0505) BP: (118-137)/(32-97) 131/32 mmHg (06/11 0505) SpO2:  [91 %-94 %] 91 % (06/11 0505)  Intake/Output from previous day: 06/10 0701 - 06/11 0700 In: 480 [P.O.:480] Out: 550 [Urine:550] Intake/Output this shift:     Recent Labs  11/23/12 1643 11/24/12 0408 11/25/12 0400  HGB 12.8* 11.0* 10.3*    Recent Labs  11/24/12 0408 11/25/12 0400  WBC 11.9* 15.3*  RBC 3.61* 3.38*  HCT 31.3* 29.3*  PLT 173 169    Recent Labs  11/24/12 0408  NA 135  K 4.1  CL 101  CO2 24  BUN 17  CREATININE 0.82  GLUCOSE 157*  CALCIUM 8.5   No results found for this basename: LABPT, INR,  in the last 72 hours  Neurovascular intact Sensation intact distally Intact pulses distally Dorsiflexion/Plantar flexion intact Incision: dressing C/D/I  Assessment/Plan: 2 Days Post-Op Procedure(s) (LRB): TOTAL HIP ARTHROPLASTY ANTERIOR APPROACH (Right) Up with therapy Dc home today after PT OV 2 weeks rx percocet and robaxin ASA for VTE proph  Bernardina Cacho M 11/25/2012, 8:09 AM

## 2012-11-25 NOTE — Progress Notes (Signed)
Occupational Therapy Treatment Patient Details Name: [name] MRN: [id number] DOB: December 23, 1940 Today's Date: 11/25/2012 Time: 1308-6578 OT Time Calculation (min): 17 min  OT Assessment / Plan / Recommendation Comments on Treatment Session Pt making progress and plannong to d/c home this afternoon with his wife    Follow Up Recommendations  No OT follow up    Barriers to Discharge   None    Equipment Recommendations  3 in 1 bedside comode;Other (comment) (ADL A/E kit)    Recommendations for Other Services    Frequency Min 2X/week   Plan Discharge plan remains appropriate    Precautions / Restrictions Precautions Precautions: Fall;Anterior Hip Restrictions Weight Bearing Restrictions: Yes RLE Weight Bearing: Weight bearing as tolerated   Pertinent Vitals/Pain 2/10 R hip    ADL  Grooming: Performed;Wash/dry hands;Wash/dry face;Set up;Min guard Where Assessed - Grooming: Supported standing Toilet Transfer: Performed;Min Pension scheme manager Method: Sit to Barista: Raised toilet seat with arms (or 3-in-1 over toilet);Grab bars Tub/Shower Transfer: Performed;Min guard Tub/Shower Transfer Method: Science writer: Shower seat without back;Grab bars;Walk in shower Equipment Used: Gait belt;Rolling walker    OT Diagnosis:    OT Problem List:   OT Treatment Interventions:     OT Goals ADL Goals ADL Goal: Toilet Transfer - Progress: Progressing toward goals ADL Goal: Tub/Shower Transfer - Progress: Progressing toward goals  Visit Information  Last OT Received On: 11/25/12 Assistance Needed: +1    Subjective Data  Subjective: " I would like to go home this afternoon after I walk with PT again " Patient Stated Goal: To return home   Prior Functioning       Cognition  Cognition Arousal/Alertness: Awake/alert Behavior During Therapy: WFL for tasks assessed/performed Overall Cognitive Status: Within Functional  Limits for tasks assessed    Mobility  Bed Mobility Bed Mobility: Not assessed Transfers Sit to Stand: 4: Min guard;From chair/3-in-1;With armrests;With upper extremity assist;From toilet;Other (comment) (from shower seat) Stand to Sit: 4: Min guard;With armrests;To chair/3-in-1;To toilet;Other (comment) (to shower seat) Details for Transfer Assistance: requires verbal cues for hand placement and safety with RW; demo difficulty standing from lower surfaces requires increased time     Exercises      Balance Balance Balance Assessed: No   End of Session OT - End of Session Equipment Utilized During Treatment: Gait belt;Other (comment) (RW, shower seat) Activity Tolerance: Patient tolerated treatment well Patient left: in chair;with call bell/phone within reach;with family/visitor present  GO     Galen Manila 11/25/2012, 12:45 PM

## 2012-11-25 NOTE — Care Management Note (Signed)
CARE MANAGEMENT NOTE 11/25/2012  Patient:  [name], [name]   Account Number:  [id number]  Date Initiated:  11/25/2012  Documentation initiated by:  Vance Peper  Subjective/Objective Assessment:   72 yr old s/p right total hip arthroplasty.     Action/Plan:   Patient preoperatively setup with [facility], no changes. Patient has rolling walker and 3in1.   Anticipated DC Date:  11/26/2012   Anticipated DC Plan:  HOME/SELF CARE      DC Planning Services  CM consult      PAC Choice  DURABLE MEDICAL EQUIPMENT   Choice offered to / List presented to:  C-1 Patient   DME arranged  WALKER - ROLLING  3-N-1      DME agency  [facility]        Status of service:  Completed, signed off Medicare Important Message given?   (If response is "NO", the following Medicare IM given date fields will be blank) Date Medicare IM given:   Date Additional Medicare IM given:    Discharge Disposition:  HOME/SELF CARE  Per UR Regulation:    If discussed at Long Length of Stay Meetings, dates discussed:    Comments:

## 2012-11-25 NOTE — Progress Notes (Signed)
Physical Therapy Treatment Patient Details Name: [name] MRN: [id number] DOB: 1941-06-07 Today's Date: 11/25/2012 Time: 2130-8657 PT Time Calculation (min): 24 min  PT Assessment / Plan / Recommendation Comments on Treatment Session  Pt progressing with therapy and moving well today. Pt is anxious with stair amb. Requires verbal cues for safety and hand placement with RW. Will benefit from 1 more PT session prior to D/c home this afternoon.     Follow Up Recommendations  Home health PT;Supervision/Assistance - 24 hour     Does the patient have the potential to tolerate intense rehabilitation     Barriers to Discharge        Equipment Recommendations  Rolling walker with 5" wheels    Recommendations for Other Services    Frequency 7X/week   Plan Discharge plan remains appropriate;Frequency remains appropriate    Precautions / Restrictions Precautions Precautions: Fall Restrictions Weight Bearing Restrictions: Yes RLE Weight Bearing: Weight bearing as tolerated   Pertinent Vitals/Pain 1-2/10; pt premedicated.     Mobility  Bed Mobility Bed Mobility: Not assessed (pt sitting in chair ) Transfers Transfers: Sit to Stand;Stand to Sit Sit to Stand: 4: Min guard;From chair/3-in-1;With armrests;With upper extremity assist Stand to Sit: 4: Min guard;With armrests;To chair/3-in-1 Details for Transfer Assistance: requires verbal cues for hand placement and safety with RW; demo difficulty standing from lower surfaces requires increased time  Ambulation/Gait Ambulation/Gait Assistance: 5: Supervision Ambulation Distance (Feet): 60 Feet Assistive device: Rolling walker Ambulation/Gait Assistance Details: verbal cues to increase stride length; pt amb with short shuffled steps, unable to correct with cues; states "my pain is not as bad with walking today' Gait Pattern: Decreased stride length;Shuffle;Narrow base of support Gait velocity: decreased due to pain Stairs: Yes Stairs  Assistance: 4: Min guard Stairs Assistance Details (indicate cue type and reason): verbal cues for sequencing and safety; requires min guard to steady and balance; pt demo anxiety and fear with steps Stair Management Technique: One rail Left;With cane;Forwards;Step to pattern (SPC in R UE ) Number of Stairs: 2 Wheelchair Mobility Wheelchair Mobility: No    Exercises Total Joint Exercises Ankle Circles/Pumps: AROM;Both;10 reps;Seated Quad Sets: AROM;Both;10 reps;Seated Gluteal Sets: AROM;10 reps;Seated Hip ABduction/ADduction: AAROM;Right;10 reps;Seated   PT Diagnosis:    PT Problem List:   PT Treatment Interventions:     PT Goals Acute Rehab PT Goals PT Goal Formulation: With patient Time For Goal Achievement: 11/30/12 Potential to Achieve Goals: Good PT Goal: Sit to Stand - Progress: Progressing toward goal PT Goal: Ambulate - Progress: Progressing toward goal PT Goal: Up/Down Stairs - Progress: Progressing toward goal PT Goal: Perform Home Exercise Program - Progress: Progressing toward goal  Visit Information  Last PT Received On: 11/25/12 Assistance Needed: +1    Subjective Data  Subjective: "im feeling better than i was yesterday"  Patient Stated Goal: home today    Cognition  Cognition Arousal/Alertness: Awake/alert Behavior During Therapy: WFL for tasks assessed/performed Overall Cognitive Status: Within Functional Limits for tasks assessed    Balance  Balance Balance Assessed: No  End of Session PT - End of Session Equipment Utilized During Treatment: Gait belt Activity Tolerance: Patient tolerated treatment well Patient left: in chair;with call bell/phone within reach Nurse Communication: Mobility status   GP     Donell Sievert, [place] [phone] 11/25/2012, 8:48 AM

## 2012-11-25 NOTE — Progress Notes (Signed)
Physical Therapy Treatment Patient Details Name: [name] MRN: [id number] DOB: 1940/09/30 Today's Date: 11/25/2012 Time: 4098-1191 PT Time Calculation (min): 28 min  PT Assessment / Plan / Recommendation Comments on Treatment Session  Treatment session included pt and family education for self-home care management. Pt requires min (A) to min guard for stair amb. Recommend family have (A) from neighbor or friend when returning home to ensure pt and caregiver safety. Pt will need RW and 3 in 1 delievered upon D/C. Will recommend HHPT and 24/7 (A) from wife when D/C.     Follow Up Recommendations  Home health PT;Supervision/Assistance - 24 hour     Does the patient have the potential to tolerate intense rehabilitation     Barriers to Discharge        Equipment Recommendations  Rolling walker with 5" wheels;Other (comment) (3 in 1 commode)    Recommendations for Other Services    Frequency 7X/week   Plan Discharge plan remains appropriate;Frequency remains appropriate    Precautions / Restrictions Precautions Precautions: Fall;Anterior Hip Restrictions Weight Bearing Restrictions: Yes RLE Weight Bearing: Weight bearing as tolerated   Pertinent Vitals/Pain 5/10; pt premedicated.     Mobility  Bed Mobility Bed Mobility: Not assessed (pt sitting in chair ) Transfers Transfers: Sit to Stand;Stand to Sit Sit to Stand: 4: Min guard;With armrests;From chair/3-in-1;With upper extremity assist Stand to Sit: 5: Supervision;To chair/3-in-1;With armrests Details for Transfer Assistance: min cues for safety; pt able to demo good technique with stand to sit; requires supervision for safety and balance  Ambulation/Gait Ambulation/Gait Assistance: 4: Min guard Ambulation Distance (Feet): 30 Feet Assistive device: Rolling walker Ambulation/Gait Assistance Details: pt demo difficulty with advancing R LE during this session; requested to amb with athletic shoes; recommend a lighter tennis  shoe or house shoe with grip to increase safety; verbal cues to increase stride length and upright posture; pt amb with decreased gt speed; min guard for safety and to steady  Gait Pattern: Decreased stride length;Shuffle;Narrow base of support Gait velocity: decreased due to pain Stairs: Yes Stairs Assistance: 4: Min assist Stairs Assistance Details (indicate cue type and reason): pt able to recall proper gt sequencing and demo safe technique; required increased (A) this session due to difficulty with advancing R LE; pt attributes new difficulty to athletic shoe. wife present and educated on safe techniques; recommend to family that they have a neighbor or church member (A) them when they return home; wife stated they would have (A)  Stair Management Technique: One rail Left;With cane;Forwards;Step to pattern Number of Stairs: 2 Wheelchair Mobility Wheelchair Mobility: No    Education/Exercises:  Total Joint Exercises Ankle Circles/Pumps: AROM;Both;10 reps;Seated Education: discussed at length stratigies for car transfer, house entry to provide family with the safest options ofr mobility and transfers when D/C from hospital. husband and wife verbalized understanding. WIfe reported they would have a neighbor or famiily friend (A) them when they arrived home. Discussed with RN the family's concern of not having DME delieved; RN verbalized understanding    PT Diagnosis:    PT Problem List:   PT Treatment Interventions:     PT Goals Acute Rehab PT Goals PT Goal Formulation: With patient Time For Goal Achievement: 11/30/12 Potential to Achieve Goals: Good PT Goal: Sit to Stand - Progress: Progressing toward goal PT Goal: Ambulate - Progress: Progressing toward goal PT Goal: Up/Down Stairs - Progress: Progressing toward goal  Visit Information  Last PT Received On: 11/25/12 Assistance Needed: +1  Subjective Data  Subjective: pt sitting in chair; wife present; both hoping to go home  stating they would like to both see the pt amb up and down steps before going home today  Patient Stated Goal: home today    Cognition  Cognition Arousal/Alertness: Awake/alert Behavior During Therapy: WFL for tasks assessed/performed Overall Cognitive Status: Within Functional Limits for tasks assessed    Balance  Balance Balance Assessed: No  End of Session PT - End of Session Equipment Utilized During Treatment: Gait belt Activity Tolerance: Patient tolerated treatment well Patient left: in chair;with call bell/phone within reach;with family/visitor present Nurse Communication: Mobility status;Other (comment) (DME needs )   GP     Donell Sievert, [phone] 11/25/2012, 2:11 PM

## 2012-11-25 NOTE — Progress Notes (Signed)
Pt discharged to home accompanied by wife. Discharge instructions and rx given and explained and pt stated understanding. Pts IV was removed. Pt left unit in a stable condition via wheelchair.  

## 2012-11-26 ENCOUNTER — Encounter (HOSPITAL_COMMUNITY): Payer: Self-pay | Admitting: Orthopaedic Surgery

## 2012-11-26 NOTE — Care Management Note (Signed)
CARE MANAGEMENT NOTE 11/26/2012  Patient:  [name], [name]   Account Number:  [id number]  Date Initiated:  11/25/2012  Documentation initiated by:  Vance Peper  Subjective/Objective Assessment:   72 yr old s/p right total hip arthroplasty.     Action/Plan:   Patient preoperatively setup with [facility], no changes. Patient has rolling walker and 3in1.   Anticipated DC Date:  11/26/2012   Anticipated DC Plan:  HOME W HOME HEALTH SERVICES      DC Planning Services  CM consult      [facility] Choice  DURABLE MEDICAL EQUIPMENT   Choice offered to / List presented to:  C-1 Patient   DME arranged  WALKER - ROLLING  3-N-1      DME agency  [facility]     HH arranged  HH-2 PT      [facility] agency  [facility]   Status of service:  Completed, signed off Medicare Important Message given?   (If response is "NO", the following Medicare IM given date fields will be blank) Date Medicare IM given:   Date Additional Medicare IM given:    Discharge Disposition:  HOME/SELF CARE  Per UR Regulation:    If discussed at Long Length of Stay Meetings, dates discussed:    Comments:  11/26/12 11:30am CM received call from patient's wife. requested home health physical therapy. CM placed call to Maud Deed, PA. Also spoke with Jodene Nam, [facility] liasion.

## 2012-12-11 NOTE — Discharge Summary (Signed)
Physician Discharge Summary  Patient ID: [name] MRN: [id number] DOB/AGE: 11-27-1940 72 y.o.  Admit date: 11/23/2012 Discharge date: 12/11/2012  Admission Diagnoses:  Osteoarthritis of right hip  Discharge Diagnoses:  Principal Problem:   Osteoarthritis of right hip   Past Medical History  Diagnosis Date  . Hypertension   . Shortness of breath     WITH EXERTION   . Sleep apnea     CPAP   10  YRS 2003   . Diabetes mellitus without complication   . Arthritis     OA  . Myocardial infarction   . Dysrhythmia   . Kidney stones     2 TINY STONES CURRENTLY   . Cancer     PROSTATE 2006    Surgeries: Procedure(s): TOTAL HIP ARTHROPLASTY ANTERIOR APPROACH on 11/23/2012   Consultants (if any):  none  Discharged Condition: Improved  Hospital Course: [name] is an 72 y.o. male who was admitted 11/23/2012 with a diagnosis of Osteoarthritis of right hip and went to the operating room on 11/23/2012 and underwent the above named procedures.    He was given perioperative antibiotics:  Anti-infectives   Start     Dose/Rate Route Frequency Ordered Stop   11/25/12 0000  ciprofloxacin (CIPRO) 500 MG tablet     500 mg Oral 2 times daily 11/25/12 1439     11/24/12 0000  minocycline (DYNACIN) tablet 50 mg  Status:  Discontinued     50 mg Oral Daily PRN 11/23/12 1616 11/23/12 1618   11/23/12 1800  ceFAZolin (ANCEF) IVPB 2 g/50 mL premix     2 g 100 mL/hr over 30 Minutes Intravenous Every 6 hours 11/23/12 1616 11/24/12 0033   11/23/12 1618  minocycline (MINOCIN,DYNACIN) capsule 50 mg  Status:  Discontinued     50 mg Oral Daily PRN 11/23/12 1619 11/25/12 1816   11/23/12 0600  ceFAZolin (ANCEF) 3 g in dextrose 5 % 50 mL IVPB     3 g 160 mL/hr over 30 Minutes Intravenous On call to O.R. 11/22/12 1347 11/23/12 1208    Progressed very well with PT and pain controlled with po meds.  Wound healing without drainage and dressing change done daily .  He was given sequential compression  devices, early ambulation, and aspirin for DVT prophylaxis.  He benefited maximally from the hospital stay and there were no complications.    Recent vital signs:  Filed Vitals:   11/25/12 0800  BP:   Pulse:   Temp:   Resp: 16    Recent laboratory studies:  Lab Results  Component Value Date   HGB 10.3* 11/25/2012   HGB 11.0* 11/24/2012   HGB 12.8* 11/23/2012   Lab Results  Component Value Date   WBC 15.3* 11/25/2012   PLT 169 11/25/2012   Lab Results  Component Value Date   INR 1.01 11/18/2012   Lab Results  Component Value Date   NA 135 11/24/2012   K 4.1 11/24/2012   CL 101 11/24/2012   CO2 24 11/24/2012   BUN 17 11/24/2012   CREATININE 0.82 11/24/2012   GLUCOSE 157* 11/24/2012    Discharge Medications:     Medication List    STOP taking these medications       thiamine 100 MG tablet      TAKE these medications       allopurinol 300 MG tablet  Commonly known as:  ZYLOPRIM  Take 300 mg by mouth daily.     aspirin 325 MG EC  tablet  Take 1 tablet (325 mg total) by mouth daily with breakfast.     ciprofloxacin 500 MG tablet  Commonly known as:  CIPRO  Take 1 tablet (500 mg total) by mouth 2 (two) times daily.     fenofibrate micronized 200 MG capsule  Commonly known as:  LOFIBRA  Take 200 mg by mouth daily before breakfast.     GLUCOSAMINE 1500 COMPLEX PO  Take 1 capsule by mouth daily.     lisinopril 10 MG tablet  Commonly known as:  PRINIVIL,ZESTRIL  Take 10 mg by mouth daily.     LOPROX 1 % shampoo  Generic drug:  Ciclopirox  Apply 1 application topically at bedtime. For dermatitis     meclizine 25 MG tablet  Commonly known as:  ANTIVERT  Take 25 mg by mouth 3 (three) times daily as needed. For motion sickness     metFORMIN 500 MG tablet  Commonly known as:  GLUCOPHAGE  Take 500 mg by mouth daily with breakfast.     methocarbamol 500 MG tablet  Commonly known as:  ROBAXIN  Take 1 tablet (500 mg total) by mouth every 6 (six) hours as needed.      metroNIDAZOLE 1 % gel  Commonly known as:  METROGEL  Apply 1 application topically 2 (two) times daily. For rash     minocycline 50 MG tablet  Commonly known as:  DYNACIN  Take 50 mg by mouth daily as needed. For roceasa     MOMETASONE FUROATE EX  Apply 1 application topically daily as needed. For rash     MSM PO  Take 1 tablet by mouth daily.     multivitamin with minerals Tabs  Take 1 tablet by mouth daily.     oxyCODONE-acetaminophen 5-325 MG per tablet  Commonly known as:  ROXICET  Take 1-2 tablets by mouth every 4 (four) hours as needed for pain.     PRESCRIPTION MEDICATION  CPAP     simvastatin 40 MG tablet  Commonly known as:  ZOCOR  Take 40 mg by mouth every evening.     vitamin C 1000 MG tablet  Take 1,000 mg by mouth daily.        Diagnostic Studies: Dg Hip Operative Right  Dec 20, 2012   *RADIOLOGY REPORT*  Clinical Data: Osteoarthritis.  DG OPERATIVE RIGHT HIP  Technique:  C-arm fluoroscopic images were obtained intraoperatively and submitted for postoperative interpretation. Please see the performing provider's procedural report for the fluoroscopy time utilized.  Comparison: None  Findings:  C-arm films document right total hip arthroplasty. There is good position of the acetabular and femoral components.  No dislocation.  Brachytherapy seeds overlie the prostate.  IMPRESSION: Satisfactory postop appearance.   Original Report Authenticated By: Davonna Belling, M.D.    Disposition: 01-Home or Self Care      Discharge Orders   Future Orders Complete By Expires     Call MD / Call 911  As directed     Comments:      If you experience chest pain or shortness of breath, CALL 911 and be transported to the hospital emergency room.  If you develope a fever above 101 F, pus (white drainage) or increased drainage or redness at the wound, or calf pain, call your surgeon's office.    Constipation Prevention  As directed     Comments:      Drink plenty of fluids.  Prune  juice may be helpful.  You may use a stool softener,  such as Colace (over the counter) 100 mg twice a day.  Use MiraLax (over the counter) for constipation as needed.    Diet - low sodium heart healthy  As directed     Discharge instructions  As directed     Comments:      Keep hip incision dry for 5 days post op then may wet while bathing. Therapy daily . Call if fever or chills or increased drainage. Go to ER if acutely short of breath or call for ambulance. Return for follow up in 2 weeks. May full weight bear on the surgical leg unless told otherwise. In house walking for first 2 weeks    Increase activity slowly as tolerated  As directed        Follow-up Information   Follow up with YATES,MARK C, MD. Schedule an appointment as soon as possible for a visit in 2 weeks. (or as scheduled)    Contact information:   [address] [place] [address] [phone]        Signed: Wende Neighbors 12/11/2012, 10:11 AM

## 2014-06-27 IMAGING — CR DG LUMBAR SPINE 2-3V
3 series · 3 of 3 positions shown · non-contrast
Comparison: Radiographs from earlier the same day only

CLINICAL DATA: Lumbar microdiskectomy

LUMBAR SPINE - 2-3 VIEW

[lateral (1 of 3)]
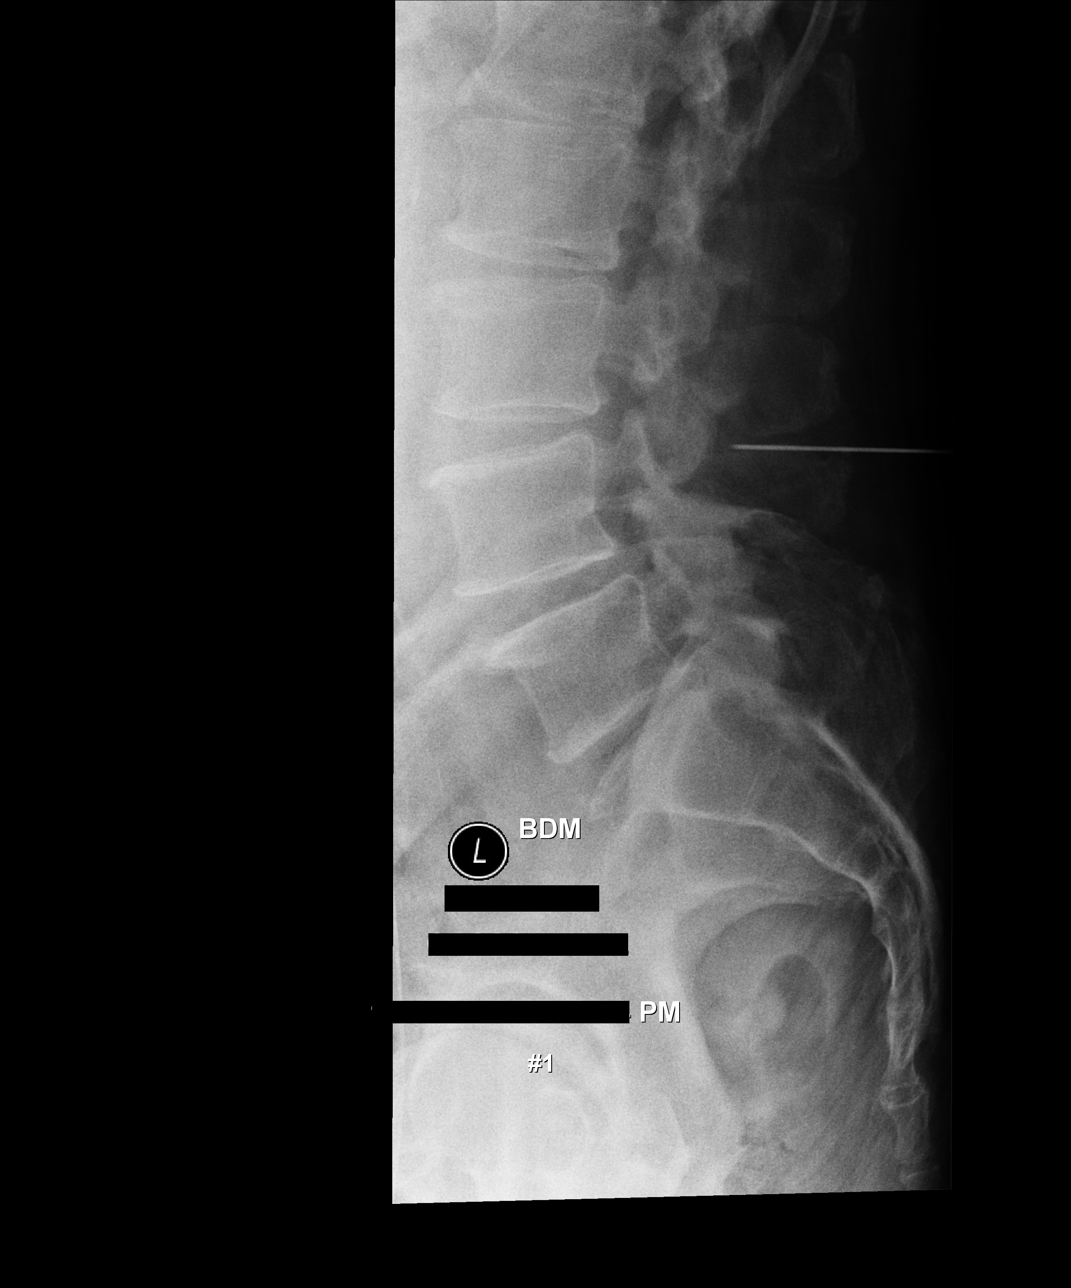

[lateral (2 of 3)]
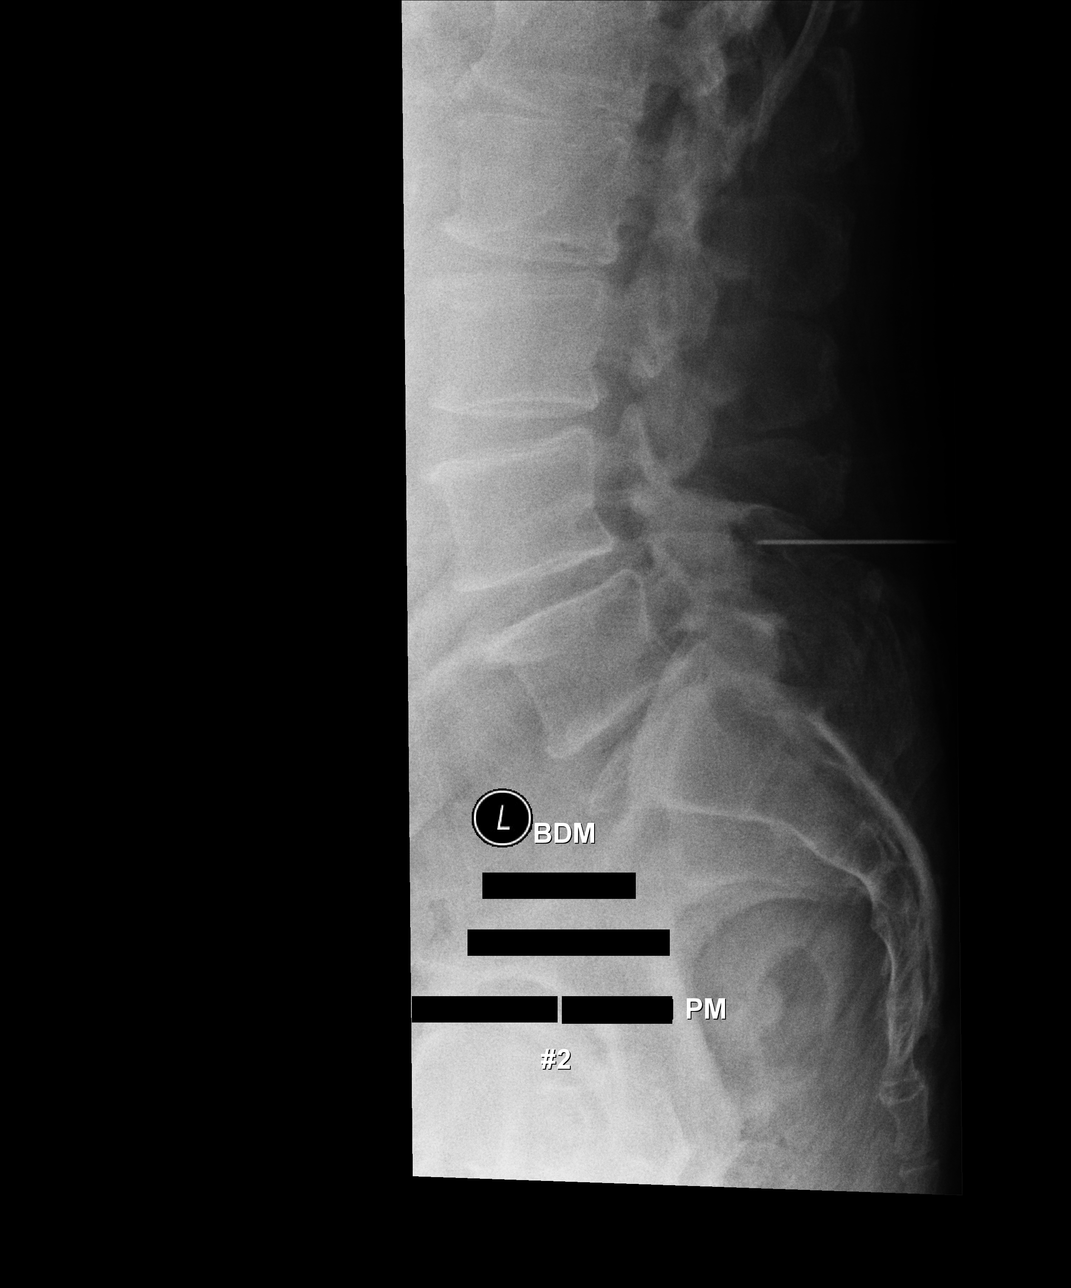

[lateral (3 of 3)]
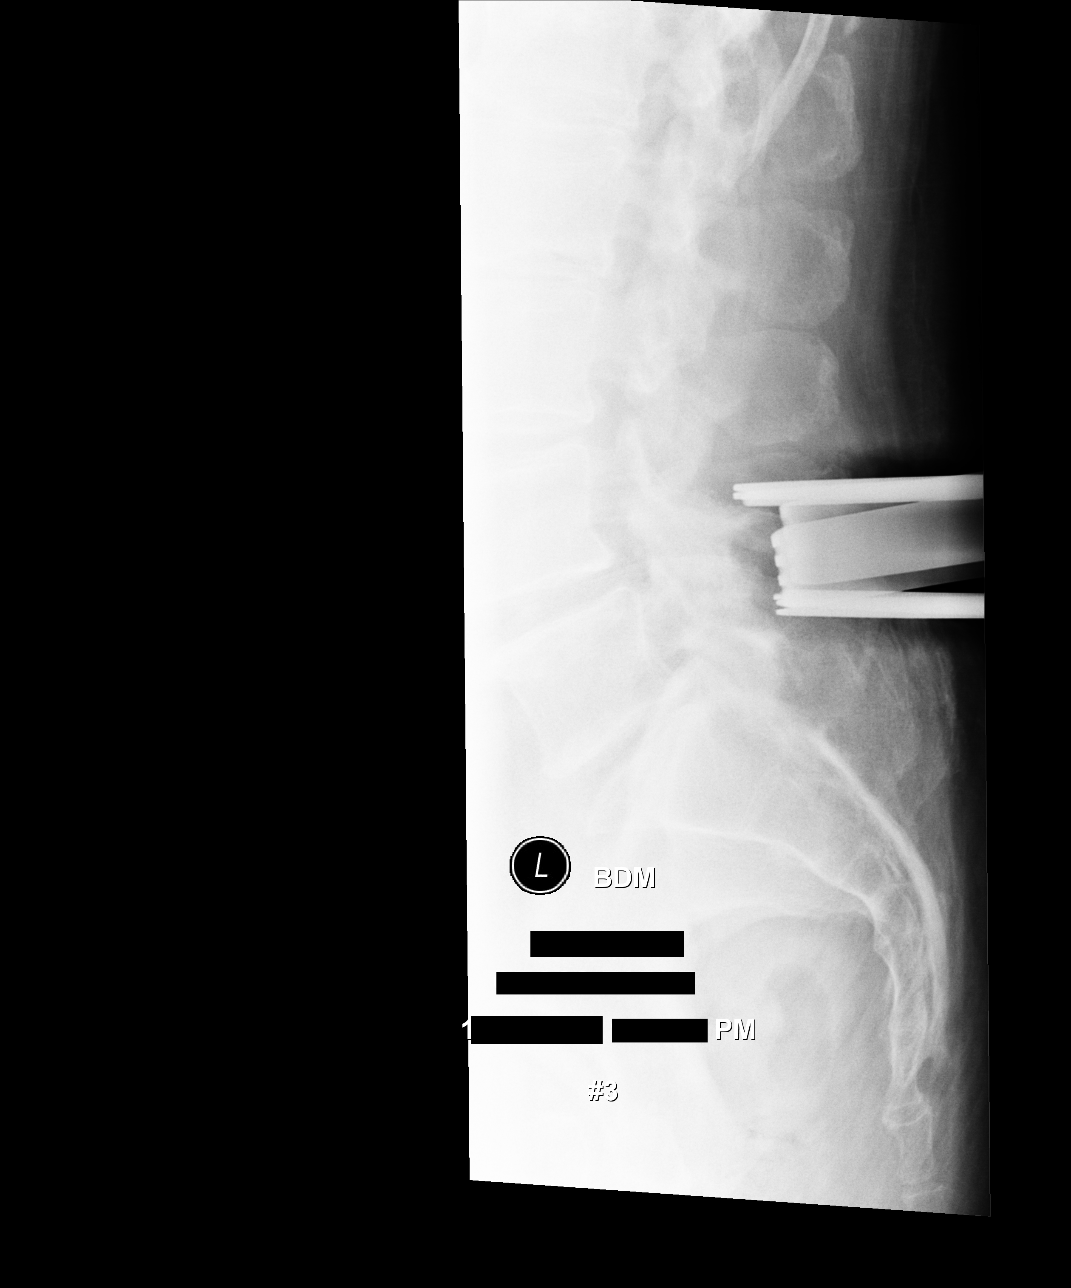

[3 of 3 positions shown; findings below may reference images not displayed]

FINDINGS: No labeled cross-sectional imaging was submitted for
comparison.  Therefore, the lowest open interspace is assigned L5-
S1.

Careful correlation of the counting scheme with any outside imaging
is recommended before intervention.

The first film shows a needle from posterior approach projecting
between L3-L4 spinous processes.  The second film shows a needle
between L4 and L5 spinous processes.  The third film shows surgical
instruments projecting over L4 and L5 spinous processes.
IMPRESSION: 1.  Intraoperative localization as above.

## 2014-12-12 IMAGING — RF DG HIP OPERATIVE*R*
1 series · 4 of 4 positions shown · non-contrast
Comparison: None

CLINICAL DATA: Osteoarthritis.

DG OPERATIVE RIGHT HIP
TECHNIQUE: C-arm fluoroscopic images were obtained
intraoperatively and submitted for postoperative interpretation.
Please see the performing provider's procedural report for the
fluoroscopy time utilized.

[Series 1: run · 4 of 4 slices shown]
[im 1/4]
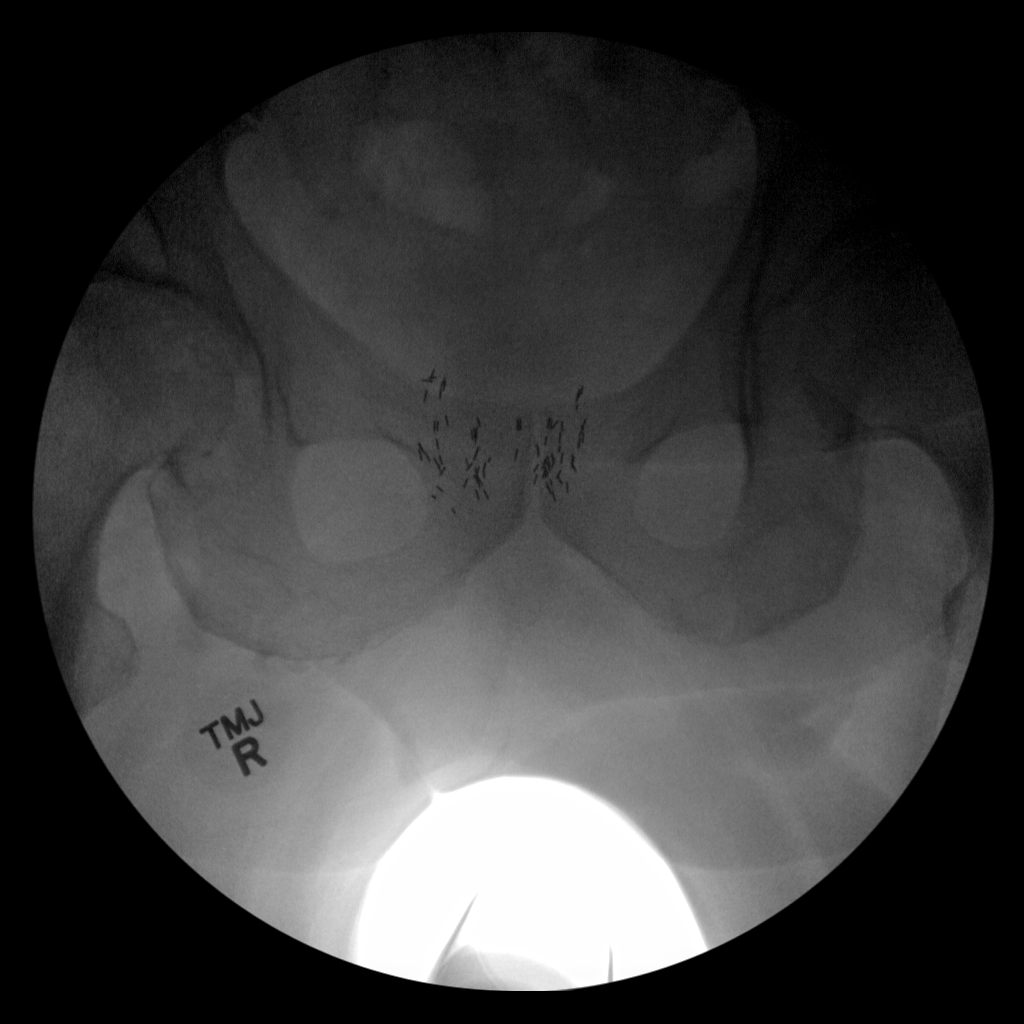
[im 2/4]
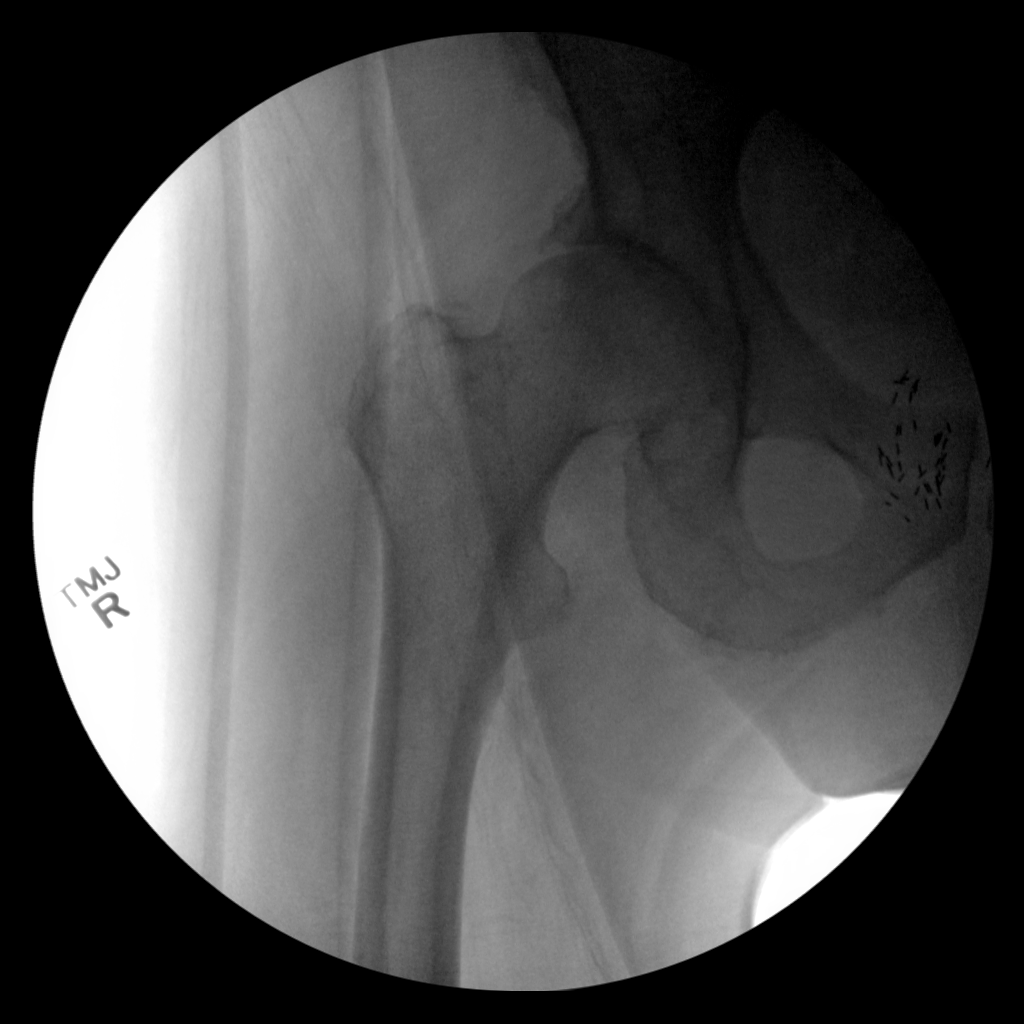
[im 3/4]
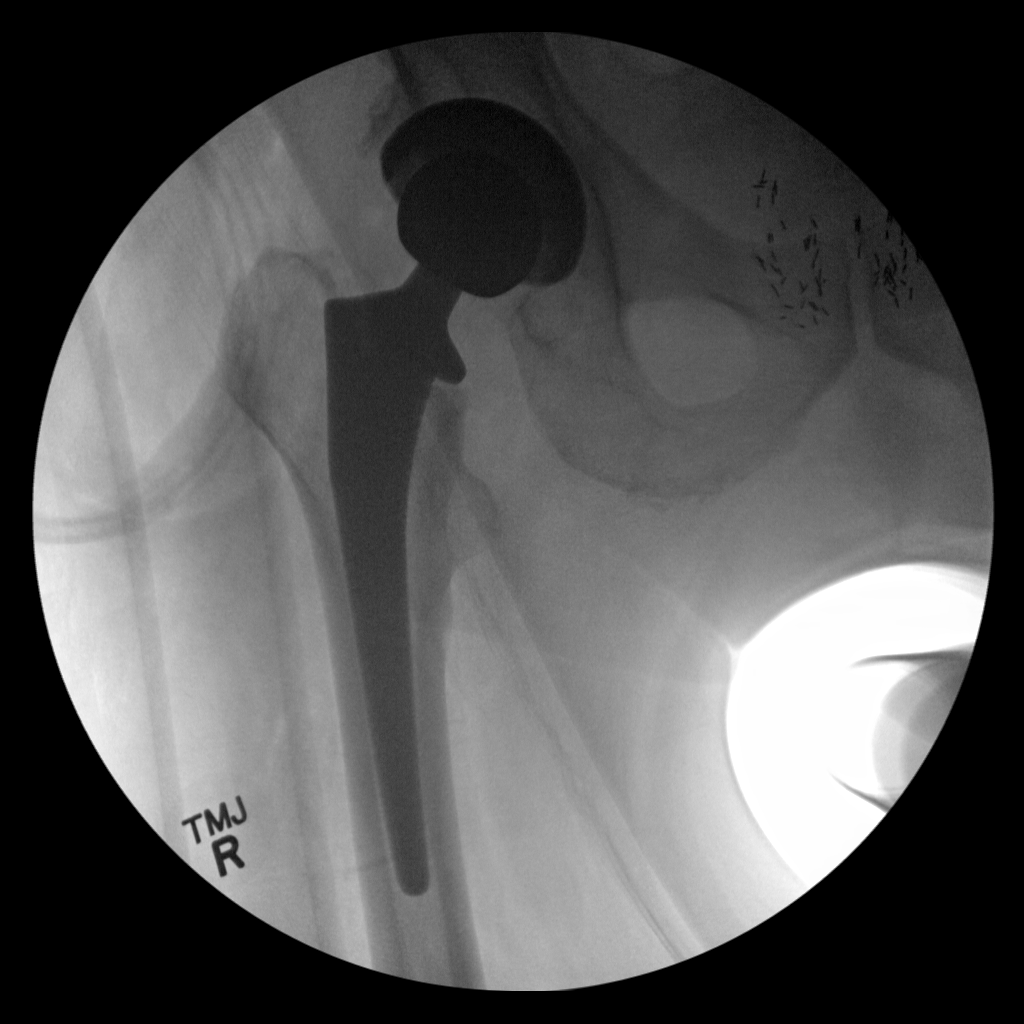
[im 4/4]
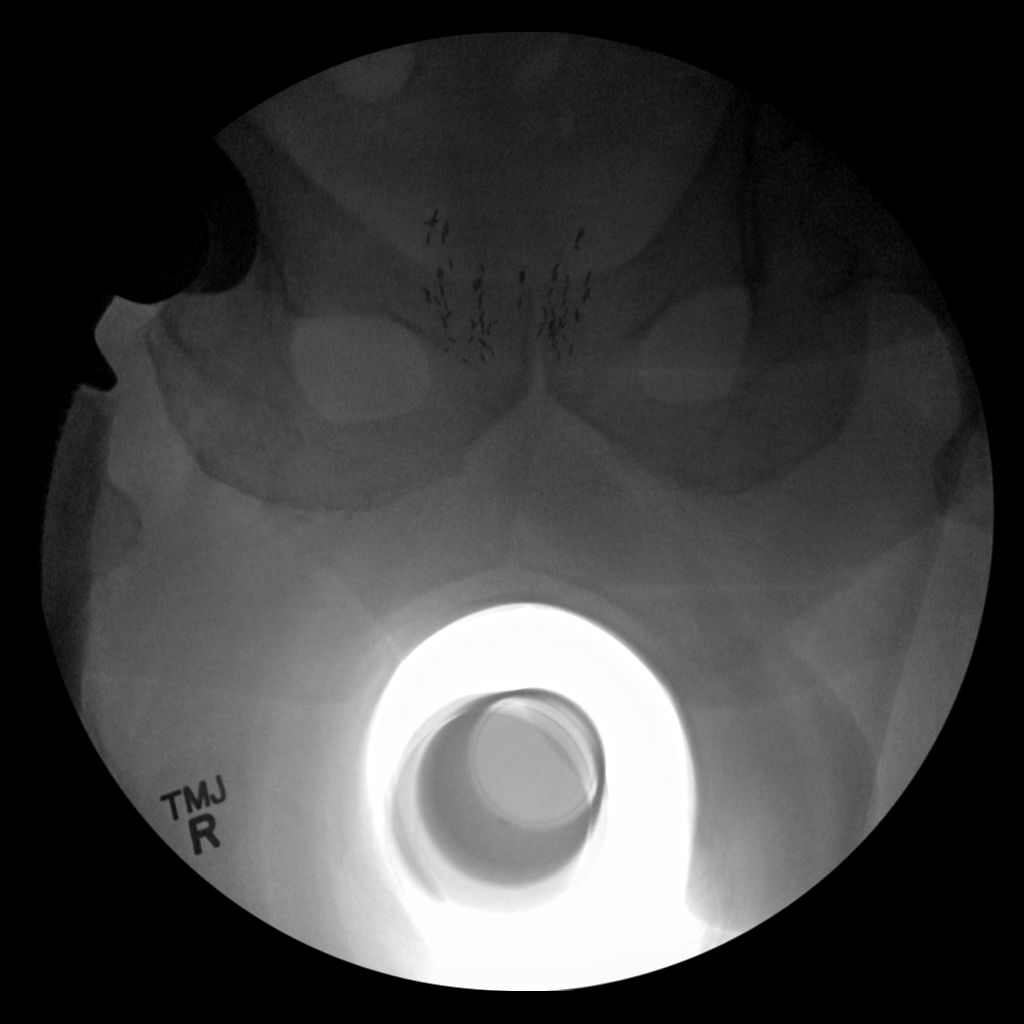

[4 of 4 positions shown; findings below may reference images not displayed]

FINDINGS: C-arm films document right total hip arthroplasty. There
is good position of the acetabular and femoral components.  No
dislocation.  Brachytherapy seeds overlie the prostate.
IMPRESSION: Satisfactory postop appearance.

## 2016-10-09 ENCOUNTER — Ambulatory Visit (INDEPENDENT_AMBULATORY_CARE_PROVIDER_SITE_OTHER): Payer: Medicare Other

## 2016-10-09 ENCOUNTER — Ambulatory Visit (INDEPENDENT_AMBULATORY_CARE_PROVIDER_SITE_OTHER): Payer: Medicare Other | Admitting: Orthopaedic Surgery

## 2016-10-09 ENCOUNTER — Encounter (INDEPENDENT_AMBULATORY_CARE_PROVIDER_SITE_OTHER): Payer: Self-pay | Admitting: Orthopaedic Surgery

## 2016-10-09 VITALS — BP 99/57 | HR 69 | Ht 70.0 in | Wt 264.0 lb

## 2016-10-09 DIAGNOSIS — G8929 Other chronic pain: Secondary | ICD-10-CM

## 2016-10-09 DIAGNOSIS — M545 Low back pain: Secondary | ICD-10-CM

## 2016-10-09 NOTE — Progress Notes (Signed)
Office Visit Note   Patient: [name]           Date of Birth: 1940-10-27           MRN: [id number] Visit Date: 10/09/2016              Requested by: Barbette Or, MD [place], [place] [id number] PCP: Oleta Mouse Charma Igo, MD   Assessment & Plan: Visit Diagnoses:  1. Chronic bilateral low back pain, with sciatica presence unspecified     Plan: This point recommend he work on the treadmill when he starts getting some fatigue he switches to the bike. After. The bike he can switch back to the treadmill and then repeat. Discussed importance working on some weight loss at 5'10" and 264 pounds. Some weight loss would help with his cardiac problems. X-rays demonstrate grade 1 anterolisthesis. Previous MRI 5 years ago did not show significant disease at other levels. He'll work on weight loss and try to the exercise enough, rest and then repeat to the help with his diet. I will check him again in 6 months. He gets progressive symptoms we can consider repeat imaging.  Follow-Up Instructions: Return in about 6 months (around 04/10/2017).   Orders:  Orders Placed This Encounter  Procedures  . XR Lumbar Spine 2-3 Views   No orders of the defined types were placed in this encounter.     Procedures: No procedures performed   Clinical Data: No additional findings.   Subjective: Chief Complaint  Patient presents with  . Lower Back - Pain    HPI patient is here with symptoms of the legs feeling tired and weak when he stands for 10 minutes or if he walks more than 10-15 minutes. He was in the [place] with family members and had to stop sedan. He's had previous L4-5 decompression surgery in December 2013. He did well after the surgery. She also had right total hip arthroplasty. He denies any left groin pain. No chills or fever. He has a pacemaker defibrillator also been told he has cardiomegaly has ejection fraction of 30% so some of his fatigue may be cardiac in  origin. Patient wanted to be checked to see if there was recurrent problems with this spinal stenosis from 5 years ago.  Review of Systems 14 point review of systems is updated. Of note is the pacemaker defibrillator, cardiomegaly, low ejection fraction. He's had previous hernia surgeries, history of cancer otherwise negative as obtained soonest history of present illness.   Objective: Vital Signs: BP (!) 99/57   Pulse 69   Ht 5\' 10"  (1.778 m)   Wt 264 lb (119.7 kg)   BMI 37.88 kg/m   Physical Exam  Constitutional: He is oriented to person, place, and time. He appears well-developed and well-nourished.  HENT:  Head: Normocephalic and atraumatic.  Eyes: EOM are normal. Pupils are equal, round, and reactive to light.  Neck: No tracheal deviation present. No thyromegaly present.  Cardiovascular: Normal rate.   Pulmonary/Chest: Effort normal. He has no wheezes.  Abdominal: Soft. Bowel sounds are normal.  Musculoskeletal:  Patient has good quad strength. No pain with hip internal/external rotation right or left. No rash or supposed skin. Lumbar incision is well-healed. No sciatic notch tenderness Dr. intrabursal tenderness. Quads hamstrings hip abductor and adductor are strong ankle dorsiflexion plantar flexion is symmetrical reflexes are symmetrical. Feet and ankles are slightly cool with decreased pulses.  Neurological: He is alert and oriented to person, place,  and time.  Skin: Skin is warm and dry. Capillary refill takes less than 2 seconds.  Psychiatric: He has a normal mood and affect. His behavior is normal. Judgment and thought content normal.    Ortho Exam  Specialty Comments:  No specialty comments available.  Imaging: Xr Lumbar Spine 2-3 Views  Result Date: 10/09/2016 The lateral lumbar spine x-rays obtained. This shows previous laminectomy at L4-5 level. There is some anterolisthesis grade 1 at L4-5 with facet arthropathy. Narrowing of adjacent levels noted as well with  the facet arthropathy. Impression: Postop decompression L4-5 level with degenerative anterolisthesis and facet arthropathy.    PMFS History: Patient Active Problem List   Diagnosis Date Noted  . Osteoarthritis of right hip 11/24/2012    Class: Diagnosis of  . Spinal stenosis, lumbar region, with neurogenic claudication 06/08/2012    Class: Diagnosis of   Past Medical History:  Diagnosis Date  . Arthritis    OA  . Cancer ([place])    PROSTATE 2006  . Diabetes mellitus without complication ([place])   . Dysrhythmia   . Hypertension   . Kidney stones    2 TINY STONES CURRENTLY   . Myocardial infarction ([place])   . Shortness of breath    WITH EXERTION   . Sleep apnea    CPAP   10  YRS 2003     No family history on file.  Past Surgical History:  Procedure Laterality Date  . APPENDECTOMY     2010  . COLONOSCOPY W/ POLYPECTOMY    . EARS     [age]  TUBES BIL EARS  . HERNIA REPAIR     1976 RIH  . LITHOTRIPSY    . LUMBAR LAMINECTOMY  06/08/2012   Procedure: MICRODISCECTOMY LUMBAR LAMINECTOMY;  Surgeon: Marybelle Killings, MD;  Location: [facility];  Service: Orthopedics;  Laterality: N/A;  L4-5 decompression  . SHOULDER SOFT TISSUE BIOPSY    . TOTAL HIP ARTHROPLASTY Right 11/23/2012   Procedure: TOTAL HIP ARTHROPLASTY ANTERIOR APPROACH;  Surgeon: Marybelle Killings, MD;  Location: [facility];  Service: Orthopedics;  Laterality: Right;  Right Total Hip Arthroplasty, Direct Anterior Approach   Social History   Occupational History  . Not on file.   Social History Main Topics  . Smoking status: Former Research scientist (life sciences)  . Smokeless tobacco: Never Used  . Alcohol use Yes     Comment: OCCAS  . Drug use: No  . Sexual activity: Not on file

## 2017-04-15 ENCOUNTER — Ambulatory Visit (INDEPENDENT_AMBULATORY_CARE_PROVIDER_SITE_OTHER): Payer: Medicare Other | Admitting: Orthopaedic Surgery

## 2021-07-18 DEATH — deceased
